# Patient Record
Sex: Female | Born: 1980 | Hispanic: Yes | Marital: Single | Smoking: Current every day smoker
Health system: Southern US, Community
[De-identification: ages and names within clinical notes are randomized; demographics above are authoritative.]

## PROBLEM LIST (undated history)

## (undated) DIAGNOSIS — F419 Anxiety disorder, unspecified: Secondary | ICD-10-CM

## (undated) DIAGNOSIS — J45909 Unspecified asthma, uncomplicated: Secondary | ICD-10-CM

## (undated) HISTORY — DX: Anxiety disorder, unspecified: F41.9

## (undated) HISTORY — PX: OTHER SURGICAL HISTORY: SHX169

---

## 2005-06-27 ENCOUNTER — Emergency Department: Payer: Self-pay | Admitting: Emergency Medicine

## 2005-06-29 ENCOUNTER — Emergency Department: Payer: Self-pay | Admitting: Emergency Medicine

## 2005-09-15 ENCOUNTER — Emergency Department: Payer: Self-pay | Admitting: General Practice

## 2006-03-09 ENCOUNTER — Emergency Department: Payer: Self-pay | Admitting: Internal Medicine

## 2007-02-27 ENCOUNTER — Emergency Department: Payer: Self-pay | Admitting: Internal Medicine

## 2007-05-30 ENCOUNTER — Emergency Department: Payer: Self-pay | Admitting: Emergency Medicine

## 2008-03-29 ENCOUNTER — Emergency Department: Payer: Self-pay | Admitting: Emergency Medicine

## 2008-10-26 ENCOUNTER — Emergency Department: Payer: Self-pay | Admitting: Emergency Medicine

## 2009-04-18 ENCOUNTER — Emergency Department: Payer: Self-pay | Admitting: Emergency Medicine

## 2010-06-28 ENCOUNTER — Emergency Department: Payer: Self-pay

## 2011-02-05 ENCOUNTER — Ambulatory Visit: Payer: Self-pay

## 2011-03-12 ENCOUNTER — Ambulatory Visit: Payer: Self-pay

## 2011-05-01 ENCOUNTER — Emergency Department: Payer: Self-pay | Admitting: Unknown Physician Specialty

## 2013-09-30 ENCOUNTER — Emergency Department: Payer: Self-pay | Admitting: Internal Medicine

## 2014-12-31 ENCOUNTER — Other Ambulatory Visit: Payer: Self-pay

## 2014-12-31 ENCOUNTER — Emergency Department
Admission: EM | Admit: 2014-12-31 | Discharge: 2014-12-31 | Disposition: A | Discharge status: Home / Self Care | Payer: Self-pay | Attending: Emergency Medicine | Admitting: Emergency Medicine

## 2014-12-31 ENCOUNTER — Emergency Department: Payer: Self-pay

## 2014-12-31 ENCOUNTER — Encounter: Payer: Self-pay | Admitting: Emergency Medicine

## 2014-12-31 DIAGNOSIS — Z3202 Encounter for pregnancy test, result negative: Secondary | ICD-10-CM | POA: Insufficient documentation

## 2014-12-31 DIAGNOSIS — M67442 Ganglion, left hand: Secondary | ICD-10-CM | POA: Insufficient documentation

## 2014-12-31 DIAGNOSIS — M79642 Pain in left hand: Secondary | ICD-10-CM

## 2014-12-31 DIAGNOSIS — S6992XA Unspecified injury of left wrist, hand and finger(s), initial encounter: Secondary | ICD-10-CM | POA: Insufficient documentation

## 2014-12-31 DIAGNOSIS — W19XXXA Unspecified fall, initial encounter: Secondary | ICD-10-CM

## 2014-12-31 DIAGNOSIS — Y9289 Other specified places as the place of occurrence of the external cause: Secondary | ICD-10-CM | POA: Insufficient documentation

## 2014-12-31 DIAGNOSIS — T148XXA Other injury of unspecified body region, initial encounter: Secondary | ICD-10-CM

## 2014-12-31 DIAGNOSIS — S3992XA Unspecified injury of lower back, initial encounter: Secondary | ICD-10-CM | POA: Insufficient documentation

## 2014-12-31 DIAGNOSIS — Y9389 Activity, other specified: Secondary | ICD-10-CM | POA: Insufficient documentation

## 2014-12-31 DIAGNOSIS — S299XXA Unspecified injury of thorax, initial encounter: Secondary | ICD-10-CM | POA: Insufficient documentation

## 2014-12-31 DIAGNOSIS — W1839XA Other fall on same level, initial encounter: Secondary | ICD-10-CM | POA: Insufficient documentation

## 2014-12-31 DIAGNOSIS — Y998 Other external cause status: Secondary | ICD-10-CM | POA: Insufficient documentation

## 2014-12-31 DIAGNOSIS — T148 Other injury of unspecified body region: Secondary | ICD-10-CM | POA: Insufficient documentation

## 2014-12-31 DIAGNOSIS — S4992XA Unspecified injury of left shoulder and upper arm, initial encounter: Secondary | ICD-10-CM | POA: Insufficient documentation

## 2014-12-31 DIAGNOSIS — M6740 Ganglion, unspecified site: Secondary | ICD-10-CM

## 2014-12-31 HISTORY — DX: Unspecified asthma, uncomplicated: J45.909

## 2014-12-31 LAB — URINALYSIS COMPLETE WITH MICROSCOPIC (ARMC ONLY)
Bacteria, UA: NONE SEEN
Bilirubin Urine: NEGATIVE
Glucose, UA: NEGATIVE mg/dL
Ketones, ur: NEGATIVE mg/dL
NITRITE: NEGATIVE
PH: 7 (ref 5.0–8.0)
PROTEIN: NEGATIVE mg/dL
Specific Gravity, Urine: 1.012 (ref 1.005–1.030)

## 2014-12-31 LAB — COMPREHENSIVE METABOLIC PANEL
ALT: 15 U/L (ref 14–54)
ANION GAP: 9 (ref 5–15)
AST: 18 U/L (ref 15–41)
Albumin: 4.3 g/dL (ref 3.5–5.0)
Alkaline Phosphatase: 65 U/L (ref 38–126)
BUN: 7 mg/dL (ref 6–20)
CHLORIDE: 106 mmol/L (ref 101–111)
CO2: 23 mmol/L (ref 22–32)
CREATININE: 0.74 mg/dL (ref 0.44–1.00)
Calcium: 8.9 mg/dL (ref 8.9–10.3)
GLUCOSE: 86 mg/dL (ref 65–99)
Potassium: 3.4 mmol/L — ABNORMAL LOW (ref 3.5–5.1)
Sodium: 138 mmol/L (ref 135–145)
Total Bilirubin: 0.5 mg/dL (ref 0.3–1.2)
Total Protein: 7.4 g/dL (ref 6.5–8.1)

## 2014-12-31 LAB — TROPONIN I: Troponin I: <0.03 ng/mL (ref <0.031)

## 2014-12-31 LAB — CBC
HCT: 39.3 % (ref 35.0–47.0)
HEMOGLOBIN: 13.1 g/dL (ref 12.0–16.0)
MCH: 28.2 pg (ref 26.0–34.0)
MCHC: 33.3 g/dL (ref 32.0–36.0)
MCV: 84.9 fL (ref 80.0–100.0)
PLATELETS: 243 10*3/uL (ref 150–440)
RBC: 4.63 MIL/uL (ref 3.80–5.20)
RDW: 15.3 % — ABNORMAL HIGH (ref 11.5–14.5)
WBC: 8.2 10*3/uL (ref 3.6–11.0)

## 2014-12-31 LAB — POCT PREGNANCY, URINE: PREG TEST UR: NEGATIVE

## 2014-12-31 MED ORDER — OXYCODONE-ACETAMINOPHEN 5-325 MG PO TABS
2.0000 | ORAL_TABLET | Freq: Once | ORAL | Status: AC
  Administered 2014-12-31: 2 via ORAL

## 2014-12-31 MED ORDER — SULFAMETHOXAZOLE-TRIMETHOPRIM 800-160 MG PO TABS: 1.0000 | ORAL_TABLET | Freq: Two times a day (BID) | ORAL | Status: DC

## 2014-12-31 MED ORDER — OXYCODONE-ACETAMINOPHEN 5-325 MG PO TABS
ORAL_TABLET | ORAL | Status: AC
  Filled 2014-12-31: qty 2

## 2014-12-31 MED ORDER — IBUPROFEN 800 MG PO TABS: 800.0000 mg | ORAL_TABLET | Freq: Three times a day (TID) | ORAL | Status: DC | PRN

## 2014-12-31 MED ORDER — DIAZEPAM 5 MG PO TABS: 5.0000 mg | ORAL_TABLET | Freq: Three times a day (TID) | ORAL | Status: DC | PRN

## 2014-12-31 NOTE — ED Notes (Signed)
States did get dizzy prior to fall

## 2014-12-31 NOTE — ED Provider Notes (Addendum)
[facility] Emergency Department Provider Note     Time seen: ----------------------------------------- 3:01 PM on 12/31/2014 -----------------------------------------    I have reviewed the triage vital signs and the nursing notes.   HISTORY  Chief Complaint Near Syncope    HPI Kristina Lowery is a 34 y.o. female who presents ER after falling several days ago. 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 Patient states after she fell she was having some back and shoulder pain which worsened after she lifted some pallets at work. Patient states she tripped and fell 2 days ago, she did not get weak and dizzy and fall or nearly pass out. She denies history of same, pain is severe and left shoulder low back. Worse with range of motion and movement.   Past Medical History  Diagnosis Date  . Asthma     There are no active problems to display for this patient.   History reviewed. No pertinent past surgical history.  Allergies Apple; Banana; Kiwi extract; and Strawberry  Social History History  Substance Use Topics  . Smoking status: Not on file  . Smokeless tobacco: Not on file  . Alcohol Use: Not on file    Review of Systems Constitutional: Negative for fever. Eyes: Negative for visual changes. ENT: Negative for sore throat. Cardiovascular: Negative for chest pain. Respiratory: Negative for shortness of breath. Gastrointestinal: Negative for abdominal pain, vomiting and diarrhea. Genitourinary: Negative for dysuria. Musculoskeletal: Positive for low back pain, shoulder pain, left rib pain, left hand pain Skin: Negative for rash. Neurological: Negative for headaches, focal weakness or numbness.  10-point ROS otherwise negative.  ____________________________________________   PHYSICAL EXAM:  VITAL SIGNS: ED Triage Vitals  Enc Vitals Group     BP 12/31/14 1255 138/97 mmHg     Pulse Rate 12/31/14 1255 83     Resp  12/31/14 1255 18     Temp 12/31/14 1255 98.2 F (36.8 C)     Temp Source 12/31/14 1255 Oral     SpO2 12/31/14 1255 99 %     Weight 12/31/14 1255 195 lb (88.451 kg)     Height 12/31/14 1255 5\' 7"  (1.702 m)     Head Cir --      Peak Flow --      Pain Score 12/31/14 1258 10     Pain Loc --      Pain Edu? --      Excl. in [place]? --     Constitutional: Alert and oriented. Well appearing and in no distress. Eyes: Conjunctivae are normal. PERRL. Normal extraocular movements. ENT   Head: Normocephalic and atraumatic.   Nose: No congestion/rhinnorhea.   Mouth/Throat: Mucous membranes are moist.   Neck: No stridor. Hematological/Lymphatic/Immunilogical: No cervical lymphadenopathy. Cardiovascular: Normal rate, regular rhythm. Normal and symmetric distal pulses are present in all extremities. No murmurs, rubs, or gallops. Respiratory: Normal respiratory effort without tachypnea nor retractions. Breath sounds are clear and equal bilaterally. No wheezes/rales/rhonchi. Gastrointestinal: Soft and nontender. No distention. No abdominal bruits. There is no CVA tenderness. Musculoskeletal: Pain range of motion of the left shoulder, left hand, there is tenderness to percussion over the left inferior anterior ribs, there is also tenderness along the lumbar sacral spine. Neurologic:  Normal speech and language. No gross focal neurologic deficits are appreciated. Speech is normal. No gait instability. Skin:  Skin is warm, dry and intact. No rash noted. Patient's small ganglion cyst noted on the dorsum of the left hand. Psychiatric: Mood and  affect are normal. Speech and behavior are normal. Patient exhibits appropriate insight and judgment. ____________________________________________  EKG: Interpreted by me. Normal sinus rhythm with normal axis normal intervals no evidence of hypertrophy or acute infarction, rate is 79.  ____________________________________________  ED COURSE:  Pertinent  labs & imaging results that were available during my care of the patient were reviewed by me and considered in my medical decision making (see chart for details). Patient likely with muscle strain and contusion status post fall. ____________________________________________    LABS (pertinent positives/negatives)  Labs Reviewed  CBC - Abnormal; Notable for the following:    RDW 15.3 (*)    All other components within normal limits  COMPREHENSIVE METABOLIC PANEL - Abnormal; Notable for the following:    Potassium 3.4 (*)    All other components within normal limits  URINALYSIS COMPLETEWITH MICROSCOPIC ([facility] ONLY) - Abnormal; Notable for the following:    Color, Urine YELLOW (*)    APPearance HAZY (*)    Hgb urine dipstick 1+ (*)    Leukocytes, UA 3+ (*)    Squamous Epithelial / LPF 6-30 (*)    All other components within normal limits  TROPONIN I  POC URINE PREG, ED  POCT PREGNANCY, URINE    RADIOLOGY Images were viewed by me  Chest x-ray, shoulder, lumbar sacral spine, hand are all unremarkable.  ____________________________________________  FINAL ASSESSMENT AND PLAN  Fall, contusion, muscle strain, ganglion cyst  Plan: X-rays are unremarkable, she'll continue on Motrin and Valium, return for worsening or worrisome symptoms. Also advised heating pad and massage therapy stretching.  Earleen Newport, MD   Earleen Newport, MD 12/31/14 [place], MD 12/31/14 [phone]

## 2014-12-31 NOTE — Discharge Instructions (Signed)
Contusion A contusion is a deep bruise. Contusions are the result of an injury that caused bleeding under the skin. The contusion may turn blue, purple, or yellow. Minor injuries will give you a painless contusion, but more severe contusions may stay painful and swollen for a few weeks.  CAUSES  A contusion is usually caused by a blow, trauma, or direct force to an area of the body. SYMPTOMS   Swelling and redness of the injured area.  Bruising of the injured area.  Tenderness and soreness of the injured area.  Pain. DIAGNOSIS  The diagnosis can be made by taking a history and physical exam. An X-ray, CT scan, or MRI may be needed to determine if there were any associated injuries, such as fractures. TREATMENT  Specific treatment will depend on what area of the body was injured. In general, the best treatment for a contusion is resting, icing, elevating, and applying cold compresses to the injured area. Over-the-counter medicines may also be recommended for pain control. Ask your caregiver what the best treatment is for your contusion. HOME CARE INSTRUCTIONS   Put ice on the injured area.  Put ice in a plastic bag.  Place a towel between your skin and the bag.  Leave the ice on for 15-20 minutes, 3-4 times a day, or as directed by your health care provider.  Only take over-the-counter or prescription medicines for pain, discomfort, or fever as directed by your caregiver. Your caregiver may recommend avoiding anti-inflammatory medicines (aspirin, ibuprofen, and naproxen) for 48 hours because these medicines may increase bruising.  Rest the injured area.  If possible, elevate the injured area to reduce swelling. SEEK IMMEDIATE MEDICAL CARE IF:   You have increased bruising or swelling.  You have pain that is getting worse.  Your swelling or pain is not relieved with medicines. MAKE SURE YOU:   Understand these instructions.  Will watch your condition.  Will get help right  away if you are not doing well or get worse. Document Released: 04/03/2005 Document Revised: 06/29/2013 Document Reviewed: 04/29/2011 [facility] Patient Information 2015 [place], [place]. This information is not intended to replace advice given to you by your health care provider. Make sure you discuss any questions you have with your health care provider.  Ganglion Cyst A ganglion cyst is a noncancerous, fluid-filled lump that occurs near joints or tendons. The ganglion cyst grows out of a joint or the lining of a tendon. It most often develops in the hand or wrist but can also develop in the shoulder, elbow, hip, knee, ankle, or foot. The round or oval ganglion can be pea sized or larger than a grape. Increased activity may enlarge the size of the cyst because more fluid starts to build up.  CAUSES  It is not completely known what causes a ganglion cyst to grow. However, it may be related to:  Inflammation or irritation around the joint.  An injury.  Repetitive movements or overuse.  Arthritis. SYMPTOMS  A lump most often appears in the hand or wrist, but can occur in other areas of the body. Generally, the lump is painless without other symptoms. However, sometimes pain can be felt during activity or when pressure is applied to the lump. The lump may even be tender to the touch. Tingling, pain, numbness, or muscle weakness can occur if the ganglion cyst presses on a nerve. Your grip may be weak and you may have less movement in your joints.  DIAGNOSIS  Ganglion cysts are most  often diagnosed based on a physical exam, noting where the cyst is and how it looks. Your caregiver will feel the lump and may shine a light alongside it. If it is a ganglion, a light often shines through it. Your caregiver may order an X-ray, ultrasound, or MRI to rule out other conditions. TREATMENT  Ganglions usually go away on their own without treatment. If pain or other symptoms are involved, treatment may be needed.  Treatment is also needed if the ganglion limits your movement or if it gets infected. Treatment options include:  Wearing a wrist or finger brace or splint.  Taking anti-inflammatory medicine.  Draining fluid from the lump with a needle (aspiration).  Injecting a steroid into the joint.  Surgery to remove the ganglion cyst and its stalk that is attached to the joint or tendon. However, ganglion cysts can grow back. HOME CARE INSTRUCTIONS   Do not press on the ganglion, poke it with a needle, or hit it with a heavy object. You may rub the lump gently and often. Sometimes fluid moves out of the cyst.  Only take medicines as directed by your caregiver.  Wear your brace or splint as directed by your caregiver. SEEK MEDICAL CARE IF:   Your ganglion becomes larger or more painful.  You have increased redness, red streaks, or swelling.  You have pus coming from the lump.  You have weakness or numbness in the affected area. MAKE SURE YOU:   Understand these instructions.  Will watch your condition.  Will get help right away if you are not doing well or get worse. Document Released: 06/21/2000 Document Revised: 03/18/2012 Document Reviewed: 08/18/2007 [facility] Patient Information 2015 [place], [place]. This information is not intended to replace advice given to you by your health care provider. Make sure you discuss any questions you have with your health care provider.  Muscle Strain A muscle strain is an injury that occurs when a muscle is stretched beyond its normal length. Usually a small number of muscle fibers are torn when this happens. Muscle strain is rated in degrees. First-degree strains have the least amount of muscle fiber tearing and pain. Second-degree and third-degree strains have increasingly more tearing and pain.  Usually, recovery from muscle strain takes 1-2 weeks. Complete healing takes 5-6 weeks.  CAUSES  Muscle strain happens when a sudden, violent force placed  on a muscle stretches it too far. This may occur with lifting, sports, or a fall.  RISK FACTORS Muscle strain is especially common in athletes.  SIGNS AND SYMPTOMS At the site of the muscle strain, there may be:  Pain.  Bruising.  Swelling.  Difficulty using the muscle due to pain or lack of normal function. DIAGNOSIS  Your health care provider will perform a physical exam and ask about your medical history. TREATMENT  Often, the best treatment for a muscle strain is resting, icing, and applying cold compresses to the injured area.  HOME CARE INSTRUCTIONS   Use the PRICE method of treatment to promote muscle healing during the first 2-3 days after your injury. The PRICE method involves:  Protecting the muscle from being injured again.  Restricting your activity and resting the injured body part.  Icing your injury. To do this, put ice in a plastic bag. Place a towel between your skin and the bag. Then, apply the ice and leave it on from 15-20 minutes each hour. After the third day, switch to moist heat packs.  Apply compression to the injured area  with a splint or elastic bandage. Be careful not to wrap it too tightly. This may interfere with blood circulation or increase swelling.  Elevate the injured body part above the level of your heart as often as you can.  Only take over-the-counter or prescription medicines for pain, discomfort, or fever as directed by your health care provider.  Warming up prior to exercise helps to prevent future muscle strains. SEEK MEDICAL CARE IF:   You have increasing pain or swelling in the injured area.  You have numbness, tingling, or a significant loss of strength in the injured area. MAKE SURE YOU:   Understand these instructions.  Will watch your condition.  Will get help right away if you are not doing well or get worse. Document Released: 06/24/2005 Document Revised: 04/14/2013 Document Reviewed: 01/21/2013 [facility] Patient  Information 2015 [place], [place]. This information is not intended to replace advice given to you by your health care provider. Make sure you discuss any questions you have with your health care provider.

## 2014-12-31 NOTE — ED Notes (Signed)
Patient transported to X-ray 

## 2016-07-17 ENCOUNTER — Emergency Department
Admission: EM | Admit: 2016-07-17 | Discharge: 2016-07-17 | Disposition: A | Discharge status: Home / Self Care | Payer: Self-pay | Attending: Emergency Medicine | Admitting: Emergency Medicine

## 2016-07-17 ENCOUNTER — Encounter: Payer: Self-pay | Admitting: Emergency Medicine

## 2016-07-17 DIAGNOSIS — J45909 Unspecified asthma, uncomplicated: Secondary | ICD-10-CM | POA: Insufficient documentation

## 2016-07-17 DIAGNOSIS — Z5321 Procedure and treatment not carried out due to patient leaving prior to being seen by health care provider: Secondary | ICD-10-CM | POA: Insufficient documentation

## 2016-07-17 DIAGNOSIS — F17200 Nicotine dependence, unspecified, uncomplicated: Secondary | ICD-10-CM | POA: Insufficient documentation

## 2016-07-17 DIAGNOSIS — R1032 Left lower quadrant pain: Secondary | ICD-10-CM | POA: Insufficient documentation

## 2016-07-17 LAB — CBC
HCT: 38.2 % (ref 35.0–47.0)
Hemoglobin: 12.9 g/dL (ref 12.0–16.0)
MCH: 28.1 pg (ref 26.0–34.0)
MCHC: 33.7 g/dL (ref 32.0–36.0)
MCV: 83.3 fL (ref 80.0–100.0)
Platelets: 257 10*3/uL (ref 150–440)
RBC: 4.59 MIL/uL (ref 3.80–5.20)
RDW: 15.4 % — ABNORMAL HIGH (ref 11.5–14.5)
WBC: 6.1 10*3/uL (ref 3.6–11.0)

## 2016-07-17 LAB — URINALYSIS, COMPLETE (UACMP) WITH MICROSCOPIC
Bacteria, UA: NONE SEEN
Bilirubin Urine: NEGATIVE
GLUCOSE, UA: NEGATIVE mg/dL
Ketones, ur: NEGATIVE mg/dL
Leukocytes, UA: NEGATIVE
Nitrite: NEGATIVE
Protein, ur: NEGATIVE mg/dL
Specific Gravity, Urine: 1.015 (ref 1.005–1.030)
pH: 8 (ref 5.0–8.0)

## 2016-07-17 LAB — COMPREHENSIVE METABOLIC PANEL
ALBUMIN: 3.9 g/dL (ref 3.5–5.0)
ALT: 13 U/L — AB (ref 14–54)
AST: 17 U/L (ref 15–41)
Alkaline Phosphatase: 77 U/L (ref 38–126)
Anion gap: 7 (ref 5–15)
BUN: 9 mg/dL (ref 6–20)
CALCIUM: 9.2 mg/dL (ref 8.9–10.3)
CO2: 27 mmol/L (ref 22–32)
Chloride: 105 mmol/L (ref 101–111)
Creatinine, Ser: 0.77 mg/dL (ref 0.44–1.00)
GFR calc Af Amer: >60 mL/min (ref >60)
GFR calc non Af Amer: >60 mL/min (ref >60)
Glucose, Bld: 105 mg/dL — ABNORMAL HIGH (ref 65–99)
Potassium: 3.4 mmol/L — ABNORMAL LOW (ref 3.5–5.1)
SODIUM: 139 mmol/L (ref 135–145)
Total Bilirubin: 0.7 mg/dL (ref 0.3–1.2)
Total Protein: 7.3 g/dL (ref 6.5–8.1)

## 2016-07-17 LAB — LIPASE, BLOOD: LIPASE: 16 U/L (ref 11–51)

## 2016-07-17 NOTE — ED Notes (Signed)
Wait explained to patient.  Wearing face mask.  No new complaints.

## 2016-07-17 NOTE — ED Triage Notes (Signed)
Pt to ed with c/o abd pain in left lower quad.  Pt also reports n/v and dizziness.  Pt also reports headache.

## 2016-12-17 ENCOUNTER — Encounter: Payer: Self-pay | Admitting: Emergency Medicine

## 2016-12-17 ENCOUNTER — Emergency Department
Admission: EM | Admit: 2016-12-17 | Discharge: 2016-12-17 | Disposition: A | Discharge status: Home / Self Care | Payer: Self-pay | Attending: Student in an Organized Health Care Education/Training Program | Admitting: Student in an Organized Health Care Education/Training Program

## 2016-12-17 DIAGNOSIS — J4521 Mild intermittent asthma with (acute) exacerbation: Secondary | ICD-10-CM | POA: Insufficient documentation

## 2016-12-17 DIAGNOSIS — F17200 Nicotine dependence, unspecified, uncomplicated: Secondary | ICD-10-CM | POA: Insufficient documentation

## 2016-12-17 DIAGNOSIS — Z79899 Other long term (current) drug therapy: Secondary | ICD-10-CM | POA: Insufficient documentation

## 2016-12-17 MED ORDER — METHYLPREDNISOLONE 4 MG PO TBPK: ORAL_TABLET | ORAL | 0 refills | Status: DC

## 2016-12-17 MED ORDER — BENZONATATE 100 MG PO CAPS: 100.0000 mg | ORAL_CAPSULE | Freq: Three times a day (TID) | ORAL | 0 refills | Status: DC | PRN

## 2016-12-17 MED ORDER — IPRATROPIUM-ALBUTEROL 0.5-2.5 (3) MG/3ML IN SOLN
3.0000 mL | Freq: Once | RESPIRATORY_TRACT | Status: AC
  Administered 2016-12-17: 3 mL via RESPIRATORY_TRACT
  Filled 2016-12-17: qty 3

## 2016-12-17 MED ORDER — ALBUTEROL SULFATE HFA 108 (90 BASE) MCG/ACT IN AERS: 2.0000 | INHALATION_SPRAY | Freq: Four times a day (QID) | RESPIRATORY_TRACT | 2 refills | Status: DC | PRN

## 2016-12-17 MED ORDER — METHYLPREDNISOLONE SODIUM SUCC 125 MG IJ SOLR
125.0000 mg | Freq: Once | INTRAMUSCULAR | Status: AC
  Administered 2016-12-17: 125 mg via INTRAMUSCULAR
  Filled 2016-12-17: qty 2

## 2016-12-17 NOTE — ED Notes (Signed)
See triage note  States her breathing became worse this am   Lost her inhaler  Audible wheezing noted  resp slightly labored

## 2016-12-17 NOTE — ED Triage Notes (Signed)
Asthma attack, onset of symptoms this morning.  Patient states she lost her Albuterol inhaler.

## 2016-12-17 NOTE — ED Provider Notes (Signed)
[facility] Emergency Department Provider Note   ____________________________________________   First MD Initiated Contact with Patient 12/17/16 1805     (approximate)  I have reviewed the triage vital signs and the nursing notes.   HISTORY  Chief Complaint Wheezing    HPI Kristina Lowery is a 36 y.o. female  Patient complaining of acute onset tach overuse of point a.m. Awakening. Patient has a history of asthma but has not needed an inhaler for over 6 months. Patient states she also has a nonproductive cough. Patient is given a history in short sentences. Patient states she no longer has an inhaler so no palliative measures taken for this complaint. Patient denies pain with this complaint. Patient does admit to tobacco use.   Past Medical History:  Diagnosis Date  . Asthma     There are no active problems to display for this patient.   History reviewed. No pertinent surgical history.  Prior to Admission medications   Medication Sig Start Date End Date Taking? Authorizing Provider  albuterol (PROVENTIL HFA;VENTOLIN HFA) 108 (90 Base) MCG/ACT inhaler Inhale 2 puffs into the lungs every 6 (six) hours as needed for wheezing or shortness of breath. 12/17/16   Sable Feil, PA-C  albuterol (PROVENTIL) (2.5 MG/3ML) 0.083% nebulizer solution Take 2.5 mg by nebulization every 6 (six) hours as needed for wheezing or shortness of breath.    [provider]  benzonatate (TESSALON PERLES) 100 MG capsule Take 1 capsule (100 mg total) by mouth 3 (three) times daily as needed for cough. 12/17/16   Sable Feil, PA-C  diazepam (VALIUM) 5 MG tablet Take 1 tablet (5 mg total) by mouth every 8 (eight) hours as needed for muscle spasms. 12/31/14   Earleen Newport, MD  diphenhydrAMINE (BENADRYL) 25 MG tablet Take 25 mg by mouth 2 (two) times daily as needed for itching or allergies.    [provider]  ibuprofen (ADVIL,MOTRIN) 200 MG tablet Take  800 mg by mouth 3 (three) times daily as needed for mild pain or moderate pain.    [provider]  ibuprofen (ADVIL,MOTRIN) 800 MG tablet Take 1 tablet (800 mg total) by mouth every 8 (eight) hours as needed. 12/31/14   Earleen Newport, MD  methylPREDNISolone (MEDROL DOSEPAK) 4 MG TBPK tablet Take Tapered dose as directed 12/17/16   Sable Feil, PA-C  sulfamethoxazole-trimethoprim (BACTRIM DS) 800-160 MG per tablet Take 1 tablet by mouth 2 (two) times daily. 12/31/14   Earleen Newport, MD    Allergies Apple; Banana; Kiwi extract; Strawberry extract; and Watermelon [citrullus vulgaris]  No family history on file.  Social History Social History  Substance Use Topics  . Smoking status: Current Every Day Smoker  . Smokeless tobacco: Never Used  . Alcohol use Yes    Review of Systems  Constitutional: No fever/chills Eyes: No visual changes. ENT: No sore throat. Cardiovascular: Denies chest pain. Respiratory:  Shortness of breath and wheezing. Gastrointestinal: No abdominal pain.  No nausea, no vomiting.  No diarrhea.  No constipation. Genitourinary: Negative for dysuria. Musculoskeletal: Negative for back pain. Skin: Negative for rash. Neurological: Negative for headaches, focal weakness or numbness. Allergic/Immunilogical: for allergies ____________________________________________   PHYSICAL EXAM:  VITAL SIGNS: ED Triage Vitals  Enc Vitals Group     BP 12/17/16 1755 135/82     Pulse Rate 12/17/16 1755 67     Resp 12/17/16 1755 18     Temp 12/17/16 1755 98.2 F (36.8 C)  Temp Source 12/17/16 1755 Oral     SpO2 12/17/16 1755 100 %     Weight 12/17/16 1756 198 lb (89.8 kg)     Height 12/17/16 1756 5\' 8"  (1.727 m)     Head Circumference --      Peak Flow --      Pain Score 12/17/16 1755 0     Pain Loc --      Pain Edu? --      Excl. in [place]? --     Constitutional: Alert and oriented. Well appearing and in no acute distress. Head:  Atraumatic. Nose: No congestion/rhinnorhea. Mouth/Throat: Mucous membranes are moist.  Oropharynx non-erythematous. Neck: No stridor.   Cardiovascular: Normal rate, regular rhythm. Grossly normal heart sounds.  Good peripheral circulation. Respiratory: Normal respiratory effort.  No retractions.  Bilateral expiratory wheezing.  Nonproductive cough Neurologic:  Normal speech and language. No gross focal neurologic deficits are appreciated. No gait instability. Skin:  Skin is warm, dry and intact. No rash noted. Psychiatric: Mood and affect are normal. Speech and behavior are normal.  ____________________________________________   LABS (all labs ordered are listed, but only abnormal results are displayed)  Labs Reviewed - No data to display ____________________________________________  EKG   ____________________________________________  RADIOLOGY  No results found.  ____________________________________________   PROCEDURES  Procedure(s) performed: None  Procedures  Critical Care performed: No  ____________________________________________   INITIAL IMPRESSION / ASSESSMENT AND PLAN / ED COURSE  Pertinent labs & imaging results that were available during my care of the patient were reviewed by me and considered in my medical decision making (see chart for details).  Asthma exacerbation.  Patient state moderate relief status post Nebulized treatment. Patient given discharge care instructions. [facility]     ____________________________________________   FINAL CLINICAL IMPRESSION(S) / ED DIAGNOSES  Final diagnoses:  Mild intermittent asthma with exacerbation      NEW MEDICATIONS STARTED DURING THIS VISIT:  New Prescriptions   ALBUTEROL (PROVENTIL HFA;VENTOLIN HFA) 108 (90 BASE) MCG/ACT INHALER    Inhale 2 puffs into the lungs every 6 (six) hours as needed for wheezing or shortness of breath.    BENZONATATE (TESSALON PERLES) 100 MG CAPSULE    Take 1 capsule (100 mg total) by mouth 3 (three) times daily as needed for cough.   METHYLPREDNISOLONE (MEDROL DOSEPAK) 4 MG TBPK TABLET    Take Tapered dose as directed     Note:  This document was prepared using Dragon voice recognition software and may include unintentional dictation errors.    Sable Feil, PA-C 12/17/16 1830    Merlyn Lot, MD 12/17/16 1919

## 2017-04-07 ENCOUNTER — Emergency Department: Payer: Self-pay

## 2017-04-07 ENCOUNTER — Encounter: Payer: Self-pay | Admitting: Emergency Medicine

## 2017-04-07 ENCOUNTER — Emergency Department
Admission: EM | Admit: 2017-04-07 | Discharge: 2017-04-07 | Disposition: A | Discharge status: Home / Self Care | Payer: Self-pay | Attending: Emergency Medicine | Admitting: Emergency Medicine

## 2017-04-07 DIAGNOSIS — Z79899 Other long term (current) drug therapy: Secondary | ICD-10-CM | POA: Insufficient documentation

## 2017-04-07 DIAGNOSIS — J4521 Mild intermittent asthma with (acute) exacerbation: Secondary | ICD-10-CM | POA: Insufficient documentation

## 2017-04-07 DIAGNOSIS — J189 Pneumonia, unspecified organism: Secondary | ICD-10-CM | POA: Insufficient documentation

## 2017-04-07 DIAGNOSIS — F17200 Nicotine dependence, unspecified, uncomplicated: Secondary | ICD-10-CM | POA: Insufficient documentation

## 2017-04-07 MED ORDER — METHYLPREDNISOLONE SODIUM SUCC 125 MG IJ SOLR
125.0000 mg | Freq: Once | INTRAMUSCULAR | Status: AC
  Administered 2017-04-07: 125 mg via INTRAMUSCULAR
  Filled 2017-04-07: qty 2

## 2017-04-07 MED ORDER — IPRATROPIUM-ALBUTEROL 0.5-2.5 (3) MG/3ML IN SOLN
3.0000 mL | Freq: Once | RESPIRATORY_TRACT | Status: AC
  Administered 2017-04-07: 3 mL via RESPIRATORY_TRACT
  Filled 2017-04-07: qty 3

## 2017-04-07 MED ORDER — ALBUTEROL SULFATE (2.5 MG/3ML) 0.083% IN NEBU: 2.5000 mg | INHALATION_SOLUTION | Freq: Four times a day (QID) | RESPIRATORY_TRACT | 0 refills | Status: DC | PRN

## 2017-04-07 MED ORDER — AZITHROMYCIN 250 MG PO TABS: ORAL_TABLET | ORAL | 0 refills | Status: DC

## 2017-04-07 MED ORDER — PREDNISONE 50 MG PO TABS: 50.0000 mg | ORAL_TABLET | Freq: Every day | ORAL | 0 refills | Status: DC

## 2017-04-07 NOTE — ED Notes (Signed)
See triage note  Presents with some SOB and wheezing  Per mom her inhaler isn't working well   Scattered wheezing noted

## 2017-04-07 NOTE — ED Provider Notes (Signed)
[facility] Emergency Department Provider Note  ____________________________________________  Time seen: Approximately 2:14 PM  I have reviewed the triage vital signs and the nursing notes.   HISTORY  Chief Complaint Wheezing    HPI Kristina Lowery is a 36 y.o. female presents emergency department complaining of wheezing, shortness of breath, chest tightness. Patient has a history of asthma but states over the past several days she has had coughing, shortness of breath, wheezing, fevers. Patient reports that she's been using her inhaler at home with no relief. Patient is now out of her inhaler. Patient does not take any daily medications. Patient denies any headache, visual changes, nasal congestions, sore throat, abdominal pain, nausea or vomiting, diarrhea or constipation.   Past Medical History:  Diagnosis Date  . Asthma     There are no active problems to display for this patient.   History reviewed. No pertinent surgical history.  Prior to Admission medications   Medication Sig Start Date End Date Taking? Authorizing Provider  albuterol (PROVENTIL HFA;VENTOLIN HFA) 108 (90 Base) MCG/ACT inhaler Inhale 2 puffs into the lungs every 6 (six) hours as needed for wheezing or shortness of breath. 12/17/16   Sable Feil, PA-C  albuterol (PROVENTIL) (2.5 MG/3ML) 0.083% nebulizer solution Take 3 mLs (2.5 mg total) by nebulization every 6 (six) hours as needed for wheezing or shortness of breath. 04/07/17   Makensey Rego, Charline Bills, PA-C  azithromycin (ZITHROMAX Z-PAK) 250 MG tablet Take 2 tablets (500 mg) on  Day 1,  followed by 1 tablet (250 mg) once daily on Days 2 through 5. 04/07/17   Ajai Terhaar, Charline Bills, PA-C  benzonatate (TESSALON PERLES) 100 MG capsule Take 1 capsule (100 mg total) by mouth 3 (three) times daily as needed for cough. 12/17/16   Sable Feil, PA-C  diazepam (VALIUM) 5 MG tablet Take 1 tablet (5 mg total) by mouth every 8 (eight) hours as  needed for muscle spasms. 12/31/14   Earleen Newport, MD  diphenhydrAMINE (BENADRYL) 25 MG tablet Take 25 mg by mouth 2 (two) times daily as needed for itching or allergies.    [provider]  ibuprofen (ADVIL,MOTRIN) 200 MG tablet Take 800 mg by mouth 3 (three) times daily as needed for mild pain or moderate pain.    [provider]  ibuprofen (ADVIL,MOTRIN) 800 MG tablet Take 1 tablet (800 mg total) by mouth every 8 (eight) hours as needed. 12/31/14   Earleen Newport, MD  methylPREDNISolone (MEDROL DOSEPAK) 4 MG TBPK tablet Take Tapered dose as directed 12/17/16   Sable Feil, PA-C  predniSONE (DELTASONE) 50 MG tablet Take 1 tablet (50 mg total) by mouth daily with breakfast. 04/07/17   Diamond Jentz, Charline Bills, PA-C  sulfamethoxazole-trimethoprim (BACTRIM DS) 800-160 MG per tablet Take 1 tablet by mouth 2 (two) times daily. 12/31/14   Earleen Newport, MD    Allergies Apple; Banana; Kiwi extract; Strawberry extract; and Watermelon [citrullus vulgaris]  No family history on file.  Social History Social History  Substance Use Topics  . Smoking status: Current Every Day Smoker  . Smokeless tobacco: Never Used  . Alcohol use Yes     Review of Systems  Constitutional: Positive fever/chills Eyes: No visual changes. No discharge ENT: No upper respiratory complaints. Cardiovascular: no chest pain. Respiratory:  Positive for cough, shotess o beath, wheeng Gastrointestinal: No abdominal pain.  No nausea, no vomiting.  No diarrhea.  No constipation. Musculoskeletal: Negative for musculoskeletal pain. Skin: Negative for rash, abrasions,  lacerations, ecchymosis. Neurological: Negative for headaches, focal weakness or numbness. 10-point ROS otherwise negative.  ____________________________________________   PHYSICAL EXAM:  VITAL SIGNS: ED Triage Vitals [04/07/17 1349]  Enc Vitals Group     BP (!) 154/92     Pulse Rate 81     Resp 18     Temp 98.3 F  (36.8 C)     Temp Source Oral     SpO2 97 %     Weight 198 lb (89.8 kg)     Height      Head Circumference      Peak Flow      Pain Score      Pain Loc      Pain Edu?      Excl. in [place]?      Constitutional: Alert and oriented. Well appearing and in no acute distress. Eyes: Conjunctivae are normal. PERRL. EOMI. Head: Atraumatic. ENT:      Ears:       Nose: No congestion/rhinnorhea.      Mouth/Throat: Mucous membranes are moist.  Neck: No stridor. Neck is supple with full range of motion Hematological/Lymphatic/Immunilogical: No cervical lymphadenopathy.  Cardiovascular: Normal rate, regular rhythm. Normal S1 and S2.  Good peripheral circulation. Respiratory: Normal respiratory effort without tachypnea or retractions. Lungs with diffuse inspiratory and respiratory wheezing bilaterally. Crackles and rhonchi bilateral lower lung fields.Kermit Balo air entry to the bases with no decreased or absent breath sounds. Musculoskeletal: Full range of motion to all extremities. No gross deformities appreciated. Neurologic:  Normal speech and language. No gross focal neurologic deficits are appreciated.  Skin:  Skin is warm, dry and intact. No rash noted. Psychiatric: Mood and affect are normal. Speech and behavior are normal. Patient exhibits appropriate insight and judgement.   ____________________________________________   LABS (all labs ordered are listed, but only abnormal results are displayed)  Labs Reviewed - No data to display ____________________________________________  EKG   ____________________________________________  RADIOLOGY Diamantina Providence Taliyah Watrous, personally viewed and evaluated these images (plain radiographs) as part of my medical decision making, as well as reviewing the written report by the radiologist.  Dg Chest 2 View  Result Date: 04/07/2017 CLINICAL DATA:  Two-day history of wheezing. EXAM: CHEST  2 VIEW COMPARISON:  12/31/2014 FINDINGS: The lungs are clear  without focal pneumonia, edema, pneumothorax or pleural effusion. The cardiopericardial silhouette is within normal limits for size. The visualized bony structures of the thorax are intact. IMPRESSION: Stable.  No acute findings. Electronically Signed   By: Misty Stanley M.D.   On: 04/07/2017 14:55    ____________________________________________    PROCEDURES  Procedure(s) performed:    Procedures    Medications  ipratropium-albuterol (DUONEB) 0.5-2.5 (3) MG/3ML nebulizer solution 3 mL (3 mLs Nebulization Given 04/07/17 1456)  methylPREDNISolone sodium succinate (SOLU-MEDROL) 125 mg/2 mL injection 125 mg (125 mg Intramuscular Given 04/07/17 1436)     ____________________________________________   INITIAL IMPRESSION / ASSESSMENT AND PLAN / ED COURSE  Pertinent labs & imaging results that were available during my care of the patient were reviewed by me and considered in my medical decision making (see chart for details).  Review of the [place] CSRS was performed in accordance of the [place] prior to dispensing any controlled drugs.     Patient's diagnosis is consistent with Community acquired pneumonia exacerbating chronic asthma. X-ray reveals no acute consolidation, however with the patient's exam, symptoms, patient will be treated empirically for community acquired pneumonia.. Patient will be discharged home with prescriptions  for Zithromax, steroids, albuterol. Patient is to follow up with primary care as needed or otherwise directed. Patient is given ED precautions to return to the ED for any worsening or new symptoms.     ____________________________________________  FINAL CLINICAL IMPRESSION(S) / ED DIAGNOSES  Final diagnoses:  Mild intermittent asthma with exacerbation  Community acquired pneumonia, unspecified laterality      NEW MEDICATIONS STARTED DURING THIS VISIT:  New Prescriptions   ALBUTEROL (PROVENTIL) (2.5 MG/3ML) 0.083% NEBULIZER SOLUTION    Take 3 mLs (2.5  mg total) by nebulization every 6 (six) hours as needed for wheezing or shortness of breath.   AZITHROMYCIN (ZITHROMAX Z-PAK) 250 MG TABLET    Take 2 tablets (500 mg) on  Day 1,  followed by 1 tablet (250 mg) once daily on Days 2 through 5.   PREDNISONE (DELTASONE) 50 MG TABLET    Take 1 tablet (50 mg total) by mouth daily with breakfast.        This chart was dictated using voice recognition software/Dragon. Despite best efforts to proofread, errors can occur which can change the meaning. Any change was purely unintentional.    Darletta Moll, PA-C 04/07/17 1548    Eula Listen, MD 04/07/17 [phone]

## 2017-04-07 NOTE — ED Triage Notes (Signed)
Pt with wheezing for two days and inhaler at home stopped working.

## 2017-08-07 ENCOUNTER — Emergency Department: Payer: Self-pay

## 2017-08-07 ENCOUNTER — Emergency Department
Admission: EM | Admit: 2017-08-07 | Discharge: 2017-08-07 | Disposition: A | Discharge status: Home / Self Care | Payer: Self-pay | Attending: Emergency Medicine | Admitting: Emergency Medicine

## 2017-08-07 ENCOUNTER — Other Ambulatory Visit: Payer: Self-pay

## 2017-08-07 DIAGNOSIS — K029 Dental caries, unspecified: Secondary | ICD-10-CM | POA: Insufficient documentation

## 2017-08-07 DIAGNOSIS — F17200 Nicotine dependence, unspecified, uncomplicated: Secondary | ICD-10-CM | POA: Insufficient documentation

## 2017-08-07 DIAGNOSIS — J45909 Unspecified asthma, uncomplicated: Secondary | ICD-10-CM | POA: Insufficient documentation

## 2017-08-07 MED ORDER — AMOXICILLIN 500 MG PO CAPS: 500.0000 mg | ORAL_CAPSULE | Freq: Three times a day (TID) | ORAL | 0 refills | Status: DC

## 2017-08-07 MED ORDER — IBUPROFEN 600 MG PO TABS: 600.0000 mg | ORAL_TABLET | Freq: Three times a day (TID) | ORAL | 0 refills | Status: DC | PRN

## 2017-08-07 NOTE — Discharge Instructions (Signed)
Continue antibiotics as directed for the next 10 days.  Ibuprofen as needed for pain.  Recall 1 of the dentist listed on your dental clinic.  Also [facility] has walk-in hours.  OPTIONS FOR DENTAL FOLLOW UP CARE  [place] [facility] and [place] [email or web address] [phone])  [facility] [phone])  [place] [phone])  [place] [phone])  [facility] [phone]) This clinic caters to the indigent population and is on a lottery system. Location: [employer] [facility], [place], [place], [place] Clinic Hours: Wednesdays from 6pm - 9pm, patients seen by a lottery system. For dates, call or go to [email or web address] Services: Cleanings, fillings and simple extractions. Payment Options: DENTAL WORK IS FREE OF CHARGE. Bring proof of income or support. Best way to get seen: Arrive at 5:15 pm - this is a lottery, NOT first come/first serve, so arriving earlier will not increase your chances of being seen.     [facility] [phone] Select option 1 for emergencies   Location: [facility] of Dentistry, [place], [address], [place] Clinic Hours: No walk-ins accepted - call the day before to schedule an appointment. Check in times are 9:30 am and 1:30 pm. Services: Simple extractions, temporary fillings, pulpectomy/pulp debridement, uncomplicated abscess drainage. Payment Options: PAYMENT IS DUE AT THE TIME OF SERVICE.  Fee is usually $100-200, additional surgical procedures (e.g. abscess drainage) may be extra. Cash, checks, Visa/MasterCard accepted.  Can file Medicaid if patient is covered for dental - patient should call case worker to check. No discount for [facility] patients. Best way to  get seen: MUST call the day before and get onto the schedule. Can usually be seen the next 1-2 days. No walk-ins accepted.     [place] [phone]   Location: [place], [place] Clinic Hours: M, W, Th, F 8am or 1:30pm, Tues 9a or 1:30 - first come/first served. Services: Simple extractions, temporary fillings, uncomplicated abscess drainage.  You do not need to be an [facility] resident. Payment Options: PAYMENT IS DUE AT THE TIME OF SERVICE. Dental insurance, otherwise sliding scale - bring proof of income or support. Depending on income and treatment needed, cost is usually $50-200. Best way to get seen: Arrive early as it is first come/first served.     [facility] [phone]   Location: [place] [facility] Hours: Mon-Thu 8a-5p Services: Most basic dental services including extractions and fillings. Payment Options: PAYMENT IS DUE AT THE TIME OF SERVICE. Sliding scale, up to 50% off - bring proof if income or support. Medicaid with dental option accepted. Best way to get seen: Call to schedule an appointment, can usually be seen within 2 weeks OR they will try to see walk-ins - show up at [place] or 2p (you may have to wait).     [facility] [place] RESIDENTS ONLY   Location: [facility], [place] [address] [place], [place] [address] Clinic Hours: By appointment only. Monday - Thursday 8am-5pm, Friday 8am-12pm Services: Cleanings, fillings, extractions. Payment Options: PAYMENT IS DUE AT THE TIME OF SERVICE. Cash, Visa or MasterCard. Sliding scale - $30 minimum per service. Best way to get seen: Come in to office, complete packet and make an appointment - need proof of income or support monies  for each household member and proof of [facility] residence. Usually takes about a month to get in.     [facility] [phone]   Location: [address] [place] Clinic Hours: Walk-in Urgent Care Dental Services are offered Monday-Friday mornings only. The numbers of emergencies accepted daily is limited to the number of providers available. Maximum 15 - Mondays, Wednesdays & Thursdays Maximum 10 - Tuesdays & Fridays Services: You do not need to be a [facility] resident to be seen for a dental emergency. Emergencies are defined as pain, swelling, abnormal bleeding, or dental trauma. Walkins will receive x-rays if needed. NOTE: Dental cleaning is not an emergency. Payment Options: PAYMENT IS DUE AT THE TIME OF SERVICE. Minimum co-pay is $40.00 for uninsured patients. Minimum co-pay is $3.00 for Medicaid with dental coverage. Dental Insurance is accepted and must be presented at time of visit. Medicare does not cover dental. Forms of payment: Cash, credit card, checks. Best way to get seen: If not previously registered with the clinic, walk-in dental registration begins at 7:15 am and is on a first come/first serve basis. If previously registered with the clinic, call to make an appointment.     [facility] [place] ONLY   Location: [address], [place], [place] Clinic Hours: Mon-Thu 10a-2p Services: Extractions only! Payment Options: FREE (donations accepted) - bring proof of income or support Best way to get seen: Call and schedule an appointment OR come at 8am on the 1st Monday of every month (except for holidays) when it is first come/first served.     [facility] [phone]   Location: [place], [place] Clinic Hours: Friday mornings Services, Payment Options, Best way to get seen: Call for info

## 2017-08-07 NOTE — ED Triage Notes (Signed)
Pt c/o left lower molar pain for the past week

## 2017-08-07 NOTE — ED Notes (Signed)
See triage note  Presents with pain to left side of face with some swelling  States she had a broken tooth to same side  States pain is now radiating into ear

## 2017-08-07 NOTE — ED Provider Notes (Signed)
[facility] Emergency Department Provider Note   ____________________________________________   First MD Initiated Contact with Patient 08/07/17 [phone]     (approximate)  I have reviewed the triage vital signs and the nursing notes.   HISTORY  Chief Complaint Dental Pain   HPI Kristina Lowery is a 37 y.o. female  Is here with complaint of left lower dental pain.  Patient states that for the past week she has had pain.  She also has been sick and had a temperature of 102 several days ago which she felt was the flu.  She also has a history of pneumonia in September.  She is unaware of any shortness of breath today but is aware that she is wheezing.  She does not take any over-the-counter medication for her dental pain.  Past Medical History:  Diagnosis Date  . Asthma     There are no active problems to display for this patient.   History reviewed. No pertinent surgical history.  Prior to Admission medications   Medication Sig Start Date End Date Taking? Authorizing Provider  albuterol (PROVENTIL HFA;VENTOLIN HFA) 108 (90 Base) MCG/ACT inhaler Inhale 2 puffs into the lungs every 6 (six) hours as needed for wheezing or shortness of breath. 12/17/16   Sable Feil, PA-C  albuterol (PROVENTIL) (2.5 MG/3ML) 0.083% nebulizer solution Take 3 mLs (2.5 mg total) by nebulization every 6 (six) hours as needed for wheezing or shortness of breath. 04/07/17   Cuthriell, Charline Bills, PA-C  amoxicillin (AMOXIL) 500 MG capsule Take 1 capsule (500 mg total) by mouth 3 (three) times daily. 08/07/17   Johnn Hai, PA-C  diphenhydrAMINE (BENADRYL) 25 MG tablet Take 25 mg by mouth 2 (two) times daily as needed for itching or allergies.    [provider]  ibuprofen (ADVIL,MOTRIN) 600 MG tablet Take 1 tablet (600 mg total) by mouth every 8 (eight) hours as needed. 08/07/17   Johnn Hai, PA-C    Allergies Apple; Banana; Kiwi extract; Strawberry extract; and  Watermelon [citrullus vulgaris]  No family history on file.  Social History Social History   Tobacco Use  . Smoking status: Current Every Day Smoker  . Smokeless tobacco: Never Used  Substance Use Topics  . Alcohol use: Yes  . Drug use: No    Review of Systems Constitutional: No fever/chills Eyes: No visual changes. ENT: Positive for dental pain. Cardiovascular: Denies chest pain. Respiratory: Denies shortness of breath.  Positive for cough and wheezing. Gastrointestinal:   No nausea, no vomiting.  Musculoskeletal: Negative for back pain. Skin: Negative for rash. Neurological: Negative for headaches, focal weakness or numbness. ____________________________________________   PHYSICAL EXAM:  VITAL SIGNS: ED Triage Vitals  Enc Vitals Group     BP 08/07/17 0818 (!) 151/80     Pulse Rate 08/07/17 0818 75     Resp 08/07/17 0818 20     Temp 08/07/17 0818 98.4 F (36.9 C)     Temp Source 08/07/17 0818 Oral     SpO2 08/07/17 0818 100 %     Weight 08/07/17 0819 198 lb (89.8 kg)     Height 08/07/17 0819 5\' 7"  (1.702 m)     Head Circumference --      Peak Flow --      Pain Score --      Pain Loc --      Pain Edu? --      Excl. in [place]? --    Constitutional: Alert and oriented. Well  appearing and in no acute distress. Eyes: Conjunctivae are normal.  Head: Atraumatic. Nose: No congestion/rhinnorhea. Mouth/Throat: Mucous membranes are moist.  Oropharynx non-erythematous.  There is gum swelling around the left lower molar posteriorly.  No active drainage is noted. Neck: No stridor.   Hematological/Lymphatic/Immunilogical: No cervical lymphadenopathy. Cardiovascular: Normal rate, regular rhythm. Grossly normal heart sounds.  Good peripheral circulation. Respiratory: Normal respiratory effort.  No retractions. Lungs diffuse expiratory wheezes heard throughout.  Patient is able to talk in complete sentences without any difficulty. Musculoskeletal: No lower extremity tenderness  nor edema.  No joint effusions. Neurologic:  Normal speech and language. No gross focal neurologic deficits are appreciated. No gait instability. Skin:  Skin is warm, dry and intact. No rash noted. Psychiatric: Mood and affect are normal. Speech and behavior are normal.  ____________________________________________   LABS (all labs ordered are listed, but only abnormal results are displayed)  Labs Reviewed - No data to display   RADIOLOGY  ED MD interpretation: No evidence of pneumonia.  Official radiology report(s): Dg Chest 2 View  Result Date: 08/07/2017 CLINICAL DATA:  Wheezing.  Shortness of breath. EXAM: CHEST  2 VIEW COMPARISON:  04/07/2017. FINDINGS: Mediastinum hilar structures normal. Lungs are clear. Heart size stable. No pulmonary venous congestion. No pleural effusion or pneumothorax. IMPRESSION: 1 no acute abnormality identified. Electronically Signed   By: Marcello Moores  Register   On: 08/07/2017 09:16   ___________________________________________   PROCEDURES  Procedure(s) performed: None  Procedures  Critical Care performed: No  ____________________________________________   INITIAL IMPRESSION / ASSESSMENT AND PLAN / ED COURSE Patient was reassured that the wheezing heard is mostly secondary to her cigarette smoking and asthma.  Patient was discharged with a prescription for amoxicillin 3 times daily for 10 days and ibuprofen 3 times daily with food.  Patient was given a list of dental clinics to follow-up with.  She is to return to the emergency department if any severe worsening of her symptoms or [facility] acute care. ____________________________________________   FINAL CLINICAL IMPRESSION(S) / ED DIAGNOSES  Final diagnoses:  Pain due to dental caries     ED Discharge Orders        Ordered    amoxicillin (AMOXIL) 500 MG capsule  3 times daily     08/07/17 0926    ibuprofen (ADVIL,MOTRIN) 600 MG tablet  Every 8 hours PRN     08/07/17 5852        Note:  This document was prepared using Dragon voice recognition software and may include unintentional dictation errors.    Johnn Hai, PA-C 08/07/17 1149    Harvest Dark, MD 08/07/17 1408

## 2017-12-08 ENCOUNTER — Emergency Department
Admission: EM | Admit: 2017-12-08 | Discharge: 2017-12-08 | Disposition: A | Discharge status: Home / Self Care | Payer: Self-pay | Attending: Emergency Medicine | Admitting: Emergency Medicine

## 2017-12-08 ENCOUNTER — Encounter: Payer: Self-pay | Admitting: Emergency Medicine

## 2017-12-08 ENCOUNTER — Other Ambulatory Visit: Payer: Self-pay

## 2017-12-08 DIAGNOSIS — J45909 Unspecified asthma, uncomplicated: Secondary | ICD-10-CM | POA: Insufficient documentation

## 2017-12-08 DIAGNOSIS — M545 Low back pain, unspecified: Secondary | ICD-10-CM

## 2017-12-08 DIAGNOSIS — K047 Periapical abscess without sinus: Secondary | ICD-10-CM | POA: Insufficient documentation

## 2017-12-08 DIAGNOSIS — F17200 Nicotine dependence, unspecified, uncomplicated: Secondary | ICD-10-CM | POA: Insufficient documentation

## 2017-12-08 LAB — URINALYSIS, COMPLETE (UACMP) WITH MICROSCOPIC
Bilirubin Urine: NEGATIVE
Glucose, UA: NEGATIVE mg/dL
HGB URINE DIPSTICK: NEGATIVE
Ketones, ur: NEGATIVE mg/dL
Leukocytes, UA: NEGATIVE
Nitrite: NEGATIVE
PROTEIN: NEGATIVE mg/dL
SPECIFIC GRAVITY, URINE: 1.014 (ref 1.005–1.030)
pH: 7 (ref 5.0–8.0)

## 2017-12-08 MED ORDER — IBUPROFEN 600 MG PO TABS: 600.0000 mg | ORAL_TABLET | Freq: Three times a day (TID) | ORAL | 0 refills | Status: DC | PRN

## 2017-12-08 MED ORDER — AMOXICILLIN 500 MG PO CAPS: 500.0000 mg | ORAL_CAPSULE | Freq: Three times a day (TID) | ORAL | 0 refills | Status: DC

## 2017-12-08 NOTE — ED Triage Notes (Signed)
Patient to ED with swelling left side of face, states she has a cracked tooth on that side, noticed a blister like area on her gum yesterday.  Obvious swelling noted.  SX began 2 weeks ago, "it popped and went down", recurring problem yesterday "it started flaring up even more".  States she was febrile yesterday - Temp 102 at home.  Took ibuprofen yesterday and this AM.  Temp 98.3 currently.

## 2017-12-08 NOTE — ED Provider Notes (Signed)
[facility] Emergency Department Provider Note ____________________________________________  Time seen: Approximately 10:36 AM  I have reviewed the triage vital signs and the nursing notes.   HISTORY  Chief Complaint Oral Swelling   HPI Kristina Lowery is a 37 y.o. female who presents to the emergency department for treatment and evaluation of dental pain.  She states that she has a cracked tooth on the left side that has been present since January.  She has had intermittent pain since then.  2 weeks ago she states that she had a abscess on her gum but it popped and then went away.  Pain and swelling returned yesterday and seems to be worse.  She states that she thinks she had a fever yesterday and this morning as well.  She took ibuprofen which provided some relief of pain and successful reduction of fever.  Patient states that she has a fairly new job and has had back pain for the past few weeks as well.  She also reports increased urinary frequency, but denies dysuria.   Past Medical History:  Diagnosis Date  . Asthma     There are no active problems to display for this patient.   History reviewed. No pertinent surgical history.  Prior to Admission medications   Medication Sig Start Date End Date Taking? Authorizing Provider  albuterol (PROVENTIL HFA;VENTOLIN HFA) 108 (90 Base) MCG/ACT inhaler Inhale 2 puffs into the lungs every 6 (six) hours as needed for wheezing or shortness of breath. 12/17/16   Sable Feil, PA-C  albuterol (PROVENTIL) (2.5 MG/3ML) 0.083% nebulizer solution Take 3 mLs (2.5 mg total) by nebulization every 6 (six) hours as needed for wheezing or shortness of breath. 04/07/17   Cuthriell, Charline Bills, PA-C  amoxicillin (AMOXIL) 500 MG capsule Take 1 capsule (500 mg total) by mouth 3 (three) times daily. 12/08/17   Myracle Febres, Johnette Abraham B, FNP  diphenhydrAMINE (BENADRYL) 25 MG tablet Take 25 mg by mouth 2 (two) times daily as needed for itching or  allergies.    [provider]  ibuprofen (ADVIL,MOTRIN) 600 MG tablet Take 1 tablet (600 mg total) by mouth every 8 (eight) hours as needed. 12/08/17   Mackensey Bolte, Johnette Abraham B, FNP    Allergies Apple; Banana; Kiwi extract; Strawberry extract; and Watermelon [citrullus vulgaris]  No family history on file.  Social History Social History   Tobacco Use  . Smoking status: Current Every Day Smoker  . Smokeless tobacco: Never Used  Substance Use Topics  . Alcohol use: Yes  . Drug use: No    Review of Systems Constitutional: Positive for fever. ENT: Positive for dental pain. Musculoskeletal: Positive for back pain. Skin: Positive for gum swelling. ____________________________________________   PHYSICAL EXAM:  VITAL SIGNS: ED Triage Vitals  Enc Vitals Group     BP 12/08/17 0835 (!) 133/95     Pulse Rate 12/08/17 0835 84     Resp 12/08/17 0835 16     Temp 12/08/17 0835 98.3 F (36.8 C)     Temp Source 12/08/17 0835 Oral     SpO2 12/08/17 0835 99 %     Weight 12/08/17 0837 201 lb (91.2 kg)     Height 12/08/17 0837 5\' 8"  (1.727 m)     Head Circumference --      Peak Flow --      Pain Score 12/08/17 0836 7     Pain Loc --      Pain Edu? --      Excl. in  [place]? --     Constitutional: Alert and oriented. Well appearing and in no acute distress. Eyes: Conjunctiva are clear without discharge or drainage. Mouth/Throat: Pinpoint area of fluctuance and purulent drainage is noted in the gumline on the left side near the back molars. Periodontal Exam Hematological/Lymphatic/Immunilogical: Palpable preauricular node on the left side. Respiratory: Respirations even and unlabored Musculoskeletal: Right back pain that radiates across and into the left side.  Mild CVA tenderness on exam. Neurologic: Awake, alert, oriented x4.  Motor and sensory function is intact throughout. Skin: No facial erythema or edema observed.  No open wound or lesion on exposed skin surfaces. Psychiatric:  Affect and behavior is appropriate.  ____________________________________________   LABS (all labs ordered are listed, but only abnormal results are displayed)  Labs Reviewed  URINALYSIS, COMPLETE (UACMP) WITH MICROSCOPIC - Abnormal; Notable for the following components:      Result Value   Color, Urine YELLOW (*)    APPearance CLEAR (*)    Bacteria, UA RARE (*)    All other components within normal limits   ____________________________________________   RADIOLOGY  Not indicated ____________________________________________   PROCEDURES  Procedure(s) performed:   Procedures  Critical Care performed: No ____________________________________________   INITIAL IMPRESSION / ASSESSMENT AND PLAN / ED COURSE  Kristina Lowery is a 37 y.o. female who presents to the emergency department for treatment and evaluation of dental pain and back pain.  Given her subjective history of fever, urinalysis was obtained despite a lack of dysuria.  No evidence of pyelonephritis is evident.  She will be placed on amoxicillin for her dental infection and was advised that she will need to see a dentist within the next 2 weeks.  She was encouraged to return to the emergency department or see her primary care provider for symptoms of concern.  Pertinent labs & imaging results that were available during my care of the patient were reviewed by me and considered in my medical decision making (see chart for details).  ____________________________________________   FINAL CLINICAL IMPRESSION(S) / ED DIAGNOSES  Final diagnoses:  Dental abscess  Acute bilateral low back pain without sciatica    Discharge Medication List as of 12/08/2017 10:37 AM      If controlled substance prescribed during this visit, 12 month history viewed on the [facility] prior to issuing an initial prescription for Schedule II or III opiod.  Note:  This document was prepared using Dragon voice recognition software and may include  unintentional dictation errors.    Victorino Dike, FNP 12/08/17 1215    Nena Polio, MD 12/08/17 [phone]

## 2017-12-08 NOTE — ED Notes (Signed)
See triage note. States she cracked a tooth  Now having swelling to gumline.  Also had fever at home   But afebrile on arrival

## 2017-12-08 NOTE — Discharge Instructions (Signed)
Please call and schedule a dental appointment as soon as possible. You will need to be seen within the next 14 days. Return to the emergency department for symptoms that change or worsen if you're unable to schedule an appointment. ° °

## 2019-05-18 ENCOUNTER — Emergency Department: Payer: Self-pay

## 2019-05-18 ENCOUNTER — Emergency Department
Admission: EM | Admit: 2019-05-18 | Discharge: 2019-05-18 | Disposition: A | Discharge status: Home / Self Care | Payer: Self-pay | Attending: Emergency Medicine | Admitting: Emergency Medicine

## 2019-05-18 ENCOUNTER — Encounter: Payer: Self-pay | Admitting: Emergency Medicine

## 2019-05-18 ENCOUNTER — Other Ambulatory Visit: Payer: Self-pay

## 2019-05-18 DIAGNOSIS — S4991XA Unspecified injury of right shoulder and upper arm, initial encounter: Secondary | ICD-10-CM | POA: Insufficient documentation

## 2019-05-18 DIAGNOSIS — Y929 Unspecified place or not applicable: Secondary | ICD-10-CM | POA: Insufficient documentation

## 2019-05-18 DIAGNOSIS — F17210 Nicotine dependence, cigarettes, uncomplicated: Secondary | ICD-10-CM | POA: Insufficient documentation

## 2019-05-18 DIAGNOSIS — J45909 Unspecified asthma, uncomplicated: Secondary | ICD-10-CM | POA: Insufficient documentation

## 2019-05-18 DIAGNOSIS — Y999 Unspecified external cause status: Secondary | ICD-10-CM | POA: Insufficient documentation

## 2019-05-18 DIAGNOSIS — Z79899 Other long term (current) drug therapy: Secondary | ICD-10-CM | POA: Insufficient documentation

## 2019-05-18 DIAGNOSIS — Y9351 Activity, roller skating (inline) and skateboarding: Secondary | ICD-10-CM | POA: Insufficient documentation

## 2019-05-18 DIAGNOSIS — W101XXA Fall (on)(from) sidewalk curb, initial encounter: Secondary | ICD-10-CM | POA: Insufficient documentation

## 2019-05-18 NOTE — ED Notes (Signed)
Back from x-ray  Awaiting results

## 2019-05-18 NOTE — Discharge Instructions (Addendum)
Please return for increasing numbness or any weakness.  Please follow-up with Dr. Sabra Heck the orthopedic surgeon on call.  Give his office a call and see if they can see you this coming week.  Use Motrin 3 of the over-the-counter pills 3 times a day with food for the next 3 or 4 days.  Try to use the arm and not keep it still all the time.

## 2019-05-18 NOTE — ED Notes (Signed)
See triage note  States she fell on Saturday while skating  [place] [address] backwards  Having pain to posterior right shoulder and arm  No deformity  Increased pain with movement

## 2019-05-18 NOTE — ED Triage Notes (Signed)
Pt reports she fell on Saturday while roller skating and since has had limited mobility as well as pain. No obvious deformity noted on assessment.

## 2019-05-18 NOTE — ED Provider Notes (Signed)
[facility] Emergency Department Provider Note   ____________________________________________   First MD Initiated Contact with Patient 05/18/19 0606     (approximate)  I have reviewed the triage vital signs and the nursing notes.   HISTORY  Chief Complaint Arm Injury    HPI Kristina Lowery is a 38 y.o. female who reports she was rollerskating Saturday night (this is very early Tuesday morning).  She fell backwards flat on her back and her arm 1 over and hit cement curb.  She complains of numbness and tingling in the right palm involving the long and index fingers and thumb.  Also has some tingling at the elbow.  There is tightness in her shoulder and her upper arm but no numbness or tingling.  There is no electric shock type pain either.  She has good motor strength in her arm.  Good pulses 2.  Patient also complains of pain in the thoracic spine where her bra strap crosses.  She took some Motrin yesterday and that seemed to help.  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         Past Medical History:  Diagnosis Date  . Asthma     There are no active problems to display for this patient.   History reviewed. No pertinent surgical history.  Prior to Admission medications   Medication Sig Start Date End Date Taking? Authorizing Provider  albuterol (PROVENTIL HFA;VENTOLIN HFA) 108 (90 Base) MCG/ACT inhaler Inhale 2 puffs into the lungs every 6 (six) hours as needed for wheezing or shortness of breath. 12/17/16   Sable Feil, PA-C  albuterol (PROVENTIL) (2.5 MG/3ML) 0.083% nebulizer solution Take 3 mLs (2.5 mg total) by nebulization every 6 (six) hours as needed for wheezing or shortness of breath. 04/07/17   Cuthriell, Charline Bills, PA-C  diphenhydrAMINE (BENADRYL) 25 MG tablet Take 25 mg by mouth 2 (two) times daily as needed for itching or allergies.    [provider]    Allergies Apple, Banana, Kiwi  extract, Strawberry extract, and Watermelon [citrullus vulgaris]  History reviewed. No pertinent family history.  Social History Social History   Tobacco Use  . Smoking status: Current Every Day Smoker  . Smokeless tobacco: Never Used  Substance Use Topics  . Alcohol use: Yes  . Drug use: No    Review of Systems  Constitutional: No fever/chills Eyes: No visual changes. ENT: No sore throat. Cardiovascular: Denies chest pain. Respiratory: Denies shortness of breath. Gastrointestinal: No abdominal pain.  No nausea, no vomiting.  No diarrhea.  No constipation. Genitourinary: Negative for dysuria. Musculoskeletal: See HPI Skin: Negative for rash. Neurological: Negative for headaches, focal weakness  ____________________________________________   PHYSICAL EXAM:  VITAL SIGNS: ED Triage Vitals [05/18/19 0521]  Enc Vitals Group     BP (!) 148/81     Pulse Rate 79     Resp 16     Temp 99 F (37.2 C)     Temp Source Oral     SpO2 97 %     Weight      Height      Head Circumference      Peak Flow      Pain Score      Pain Loc      Pain Edu?      Excl. in [place]?    Constitutional: Alert and oriented.  Holding her right arm up to her chest Eyes: Conjunctivae are normal.  Head: Atraumatic. Nose: No congestion/rhinnorhea. Mouth/Throat: Mucous membranes are moist.  Oropharynx non-erythematous. Neck: No stridor.  No cervical spine tenderness to palpation. Cardiovascular: Normal rate, regular rhythm.  Good peripheral circulation. Respiratory: Normal respiratory effort.  No retractions.  Musculoskeletal: Numbness is described in HPI there is no numbness elsewhere in the arm.  She has good range of motion of all the joints. Neurologic:  Normal speech and language. No gross focal neurologic deficits are appreciated.  Normal motor strength in the fingers hand and wrist and elbow.  Shoulder shrug is normal as well. Skin:  Skin is warm, dry and intact. No rash noted.    ____________________________________________   LABS (all labs ordered are listed, but only abnormal results are displayed)  Labs Reviewed - No data to display ____________________________________________  EKG   ____________________________________________  RADIOLOGY  ED MD interpretation: X-rays of the shoulder humerus and forearm are normal per radiology I reviewed the films T-spine films reviewed by me show some DJD I do not see any acute fractures.  Official radiology report(s): Dg Shoulder Right  Result Date: 05/18/2019 CLINICAL DATA:  Fall 3 days ago.  Pain.  Limited mobility. EXAM: RIGHT SHOULDER - 2+ VIEW COMPARISON:  None. FINDINGS: There is no evidence of fracture or dislocation. There is no evidence of arthropathy or other focal bone abnormality. Soft tissues are unremarkable. IMPRESSION: Negative right shoulder radiographs. Electronically Signed   By: San Morelle M.D.   On: 05/18/2019 05:55   Dg Forearm Right  Result Date: 05/18/2019 CLINICAL DATA:  Fall 3 days ago.  Pain.  Limited mobility. EXAM: RIGHT FOREARM - 2 VIEW COMPARISON:  None. FINDINGS: There is no evidence of fracture or other focal bone lesions. Soft tissues are unremarkable. IMPRESSION: Negative right forearm radiographs. Electronically Signed   By: San Morelle M.D.   On: 05/18/2019 05:59   Dg Humerus Right  Result Date: 05/18/2019 CLINICAL DATA:  Fall 3 days ago.  Pain.  Limited mobility. EXAM: RIGHT HUMERUS - 2+ VIEW COMPARISON:  Right shoulder radiographs of the same day. FINDINGS: There is no evidence of fracture or other focal bone lesions. Soft tissues are unremarkable. IMPRESSION: Negative right humerus. Electronically Signed   By: San Morelle M.D.   On: 05/18/2019 05:56    ____________________________________________   PROCEDURES  Procedure(s) performed (including Critical Care):  Procedures   ____________________________________________   INITIAL IMPRESSION  / ASSESSMENT AND PLAN / ED COURSE  Kristina Lowery was evaluated in Emergency Department on 05/18/2019 for the symptoms described in the history of present illness. She was evaluated in the context of the global COVID-19 pandemic, which necessitated consideration that the patient might be at risk for infection with the SARS-CoV-2 virus that causes COVID-19. Institutional protocols and algorithms that pertain to the evaluation of patients at risk for COVID-19 are in a state of rapid change based on information released by regulatory bodies including the CDC and federal and state organizations. These policies and algorithms were followed during the patient's care in the ED.              ____________________________________________   FINAL CLINICAL IMPRESSION(S) / ED DIAGNOSES  Final diagnoses:  Injury of right upper extremity, initial encounter     ED Discharge Orders    None       Note:  This document was prepared using Dragon voice recognition software and may include unintentional dictation errors.    Nena Polio, MD 05/18/19 [phone]

## 2019-12-12 ENCOUNTER — Encounter: Payer: Self-pay | Admitting: Emergency Medicine

## 2019-12-12 ENCOUNTER — Emergency Department: Payer: Self-pay

## 2019-12-12 ENCOUNTER — Other Ambulatory Visit: Payer: Self-pay

## 2019-12-12 ENCOUNTER — Emergency Department
Admission: EM | Admit: 2019-12-12 | Discharge: 2019-12-12 | Disposition: A | Discharge status: Home / Self Care | Payer: Self-pay | Attending: Emergency Medicine | Admitting: Emergency Medicine

## 2019-12-12 DIAGNOSIS — R05 Cough: Secondary | ICD-10-CM | POA: Insufficient documentation

## 2019-12-12 DIAGNOSIS — J029 Acute pharyngitis, unspecified: Secondary | ICD-10-CM | POA: Insufficient documentation

## 2019-12-12 DIAGNOSIS — J069 Acute upper respiratory infection, unspecified: Secondary | ICD-10-CM | POA: Insufficient documentation

## 2019-12-12 DIAGNOSIS — Z20822 Contact with and (suspected) exposure to covid-19: Secondary | ICD-10-CM | POA: Insufficient documentation

## 2019-12-12 DIAGNOSIS — R0602 Shortness of breath: Secondary | ICD-10-CM | POA: Insufficient documentation

## 2019-12-12 DIAGNOSIS — M7918 Myalgia, other site: Secondary | ICD-10-CM | POA: Insufficient documentation

## 2019-12-12 DIAGNOSIS — F17200 Nicotine dependence, unspecified, uncomplicated: Secondary | ICD-10-CM | POA: Insufficient documentation

## 2019-12-12 DIAGNOSIS — J45909 Unspecified asthma, uncomplicated: Secondary | ICD-10-CM | POA: Insufficient documentation

## 2019-12-12 LAB — SARS CORONAVIRUS 2 (TAT 6-24 HRS): SARS Coronavirus 2: NEGATIVE

## 2019-12-12 MED ORDER — ALBUTEROL SULFATE HFA 108 (90 BASE) MCG/ACT IN AERS: 2.0000 | INHALATION_SPRAY | Freq: Four times a day (QID) | RESPIRATORY_TRACT | 0 refills | Status: DC | PRN

## 2019-12-12 MED ORDER — AZITHROMYCIN 250 MG PO TABS: ORAL_TABLET | ORAL | 0 refills | Status: DC

## 2019-12-12 MED ORDER — AZITHROMYCIN 250 MG PO TABS: ORAL_TABLET | ORAL | 0 refills | Status: AC

## 2019-12-12 MED ORDER — DEXAMETHASONE 10 MG/ML FOR PEDIATRIC ORAL USE
16.0000 mg | Freq: Once | INTRAMUSCULAR | Status: AC
  Administered 2019-12-12: 16 mg via ORAL
  Filled 2019-12-12: qty 2

## 2019-12-12 MED ORDER — ALBUTEROL SULFATE HFA 108 (90 BASE) MCG/ACT IN AERS: 2.0000 | INHALATION_SPRAY | Freq: Four times a day (QID) | RESPIRATORY_TRACT | 0 refills | Status: AC | PRN

## 2019-12-12 MED ORDER — PREDNISONE 10 MG PO TABS: ORAL_TABLET | ORAL | 0 refills | Status: AC

## 2019-12-12 MED ORDER — IPRATROPIUM-ALBUTEROL 0.5-2.5 (3) MG/3ML IN SOLN
3.0000 mL | Freq: Once | RESPIRATORY_TRACT | Status: AC
Start: 2019-12-12 — End: 2019-12-12
  Administered 2019-12-12: 3 mL via RESPIRATORY_TRACT
  Filled 2019-12-12: qty 3

## 2019-12-12 MED ORDER — PREDNISONE 10 MG PO TABS: ORAL_TABLET | ORAL | 0 refills | Status: DC

## 2019-12-12 NOTE — ED Triage Notes (Signed)
Pt to ED via POV c/o generalized body aches, sore throat, and cough. Pt states that she has hx/o asthma and had an asthma attack yesterday. Pt reports having runny nose as well. Pt is able to speak in complete sentences at this time and not noted to be in distress.

## 2019-12-12 NOTE — ED Provider Notes (Signed)
[facility] Emergency Department Provider Note  ____________________________________________  Time seen: Approximately 8:34 AM  I have reviewed the triage vital signs and the nursing notes.   HISTORY  Chief Complaint Generalized Body Aches, Cough, and Nasal Congestion    HPI Kristina Lowery is a 39 y.o. female with past medical history of asthma that presents to the emergency department for evaluation of nasal congestion, sore throat, cough, SOB, body aches for 6 days. She started with diarrhea today. She used a nebulizer treatment at home.  She actually ran out of her nebulizer treatment and had to use her nephews.  She is out of her albuterol inhaler.  Patient was at the beach last weekend, just before symptoms started.  She has not been vaccinated against COVID-19.  No sick contacts.  No CP.    Past Medical History:  Diagnosis Date  . Asthma     There are no problems to display for this patient.   History reviewed. No pertinent surgical history.  Prior to Admission medications   Medication Sig Start Date End Date Taking? Authorizing Provider  albuterol (VENTOLIN HFA) 108 (90 Base) MCG/ACT inhaler Inhale 2 puffs into the lungs every 6 (six) hours as needed for wheezing or shortness of breath. 12/12/19   Laban Emperor, PA-C  azithromycin (ZITHROMAX Z-PAK) 250 MG tablet Take 2 tablets (500 mg) on  Day 1,  followed by 1 tablet (250 mg) once daily on Days 2 through 5. 12/12/19   Laban Emperor, PA-C  diphenhydrAMINE (BENADRYL) 25 MG tablet Take 25 mg by mouth 2 (two) times daily as needed for itching or allergies.    [provider]  predniSONE (DELTASONE) 10 MG tablet Take 6 tablets on day 1, take 5 tablets on day 2, take 4 tablets on day 3, take 3 tablets on day 4, take 2 tablets on day 5, take 1 tablet on day 6 12/12/19   Laban Emperor, PA-C    Allergies Apple, Banana, Kiwi extract, Strawberry extract, and Watermelon [citrullus vulgaris]  No family  history on file.  Social History Social History   Tobacco Use  . Smoking status: Current Every Day Smoker  . Smokeless tobacco: Never Used  Substance Use Topics  . Alcohol use: Yes  . Drug use: No     Review of Systems  Constitutional: No fever/chills Eyes: No visual changes. No discharge. ENT: Positive for congestion and rhinorrhea. Cardiovascular: No chest pain. Respiratory: Positive for cough and SOB. Gastrointestinal: No abdominal pain.  No nausea, no vomiting.  No diarrhea.  No constipation. Musculoskeletal: Positive for body aches. Skin: Negative for rash, abrasions, lacerations, ecchymosis. Neurological: Negative for headaches.   ____________________________________________   PHYSICAL EXAM:  VITAL SIGNS: ED Triage Vitals  Enc Vitals Group     BP 12/12/19 0754 (!) 152/100     Pulse Rate 12/12/19 0754 86     Resp 12/12/19 0754 18     Temp 12/12/19 0754 98.4 F (36.9 C)     Temp Source 12/12/19 0754 Oral     SpO2 12/12/19 0754 93 %     Weight 12/12/19 0754 210 lb (95.3 kg)     Height 12/12/19 0754 5\' 7"  (1.702 m)     Head Circumference --      Peak Flow --      Pain Score 12/12/19 0759 6     Pain Loc --      Pain Edu? --      Excl. in [place]? --  Constitutional: Alert and oriented. Well appearing and in no acute distress. Eyes: Conjunctivae are normal. PERRL. EOMI. No discharge. Head: Atraumatic. ENT: No frontal and maxillary sinus tenderness.      Ears: Tympanic membranes pearly gray with good landmarks. No discharge.      Nose: Mild congestion/rhinnorhea.      Mouth/Throat: Mucous membranes are moist. Oropharynx non-erythematous. Tonsils not enlarged. No exudates. Uvula midline. Neck: No stridor.   Hematological/Lymphatic/Immunilogical: No cervical lymphadenopathy. Cardiovascular: Normal rate, regular rhythm.  Good peripheral circulation. Respiratory: Normal respiratory effort without tachypnea or retractions. Lungs CTAB. Good air entry to the bases  with no decreased or absent breath sounds. Gastrointestinal: Bowel sounds 4 quadrants. Soft and nontender to palpation. No guarding or rigidity. No palpable masses. No distention. Musculoskeletal: Full range of motion to all extremities. No gross deformities appreciated. Neurologic:  Normal speech and language. No gross focal neurologic deficits are appreciated.  Skin:  Skin is warm, dry and intact. No rash noted. Psychiatric: Mood and affect are normal. Speech and behavior are normal. Patient exhibits appropriate insight and judgement.   ____________________________________________   LABS (all labs ordered are listed, but only abnormal results are displayed)  Labs Reviewed  SARS CORONAVIRUS 2 (TAT 6-24 HRS)   ____________________________________________  EKG   ____________________________________________  RADIOLOGY Robinette Haines, personally viewed and evaluated these images (plain radiographs) as part of my medical decision making, as well as reviewing the written report by the radiologist.  DG Chest 1 View  Result Date: 12/12/2019 CLINICAL DATA:  Generalized body aches, sore throat, cough. History of asthma and smoking. EXAM: CHEST  1 VIEW COMPARISON:  08/07/2017 FINDINGS: Heart size is normal. No focal consolidations or pleural effusions. No pulmonary edema. IMPRESSION: No active disease. Electronically Signed   By: Nolon Nations M.D.   On: 12/12/2019 08:49    ____________________________________________    PROCEDURES  Procedure(s) performed:    Procedures    Medications  ipratropium-albuterol (DUONEB) 0.5-2.5 (3) MG/3ML nebulizer solution 3 mL (3 mLs Nebulization Given 12/12/19 0928)  dexamethasone (DECADRON) 10 MG/ML injection for Pediatric ORAL use 16 mg (16 mg Oral Given 12/12/19 0927)     ____________________________________________   INITIAL IMPRESSION / ASSESSMENT AND PLAN / ED COURSE  Pertinent labs & imaging results that were available during my  care of the patient were reviewed by me and considered in my medical decision making (see chart for details).  Review of the [place] CSRS was performed in accordance of the [place] prior to dispensing any controlled drugs.     Patient's diagnosis is consistent with URI. Vital signs and exam are reassuring.  Chest x-ray negative for acute cardiopulmonary processes.  Patient was given a DuoNeb and Decadron in the emergency department.  O2 saturations remained 97 - 100% following medications.  Covid test is pending.  Patient appears well and is staying well hydrated. Patient should alternate tylenol and ibuprofen for fever. Patient feels comfortable going home. Patient will be discharged home with prescriptions for azithromycin, prednisone, albuterol inhaler. Patient is to follow up with primary care as needed or otherwise directed. Patient is given ED precautions to return to the ED for any worsening or new symptoms.   Lezlee Gills was evaluated in Emergency Department on 12/12/2019 for the symptoms described in the history of present illness. She was evaluated in the context of the global COVID-19 pandemic, which necessitated consideration that the patient might be at risk for infection with the SARS-CoV-2 virus that causes COVID-19. Institutional protocols and algorithms  that pertain to the evaluation of patients at risk for COVID-19 are in a state of rapid change based on information released by regulatory bodies including the CDC and federal and state organizations. These policies and algorithms were followed during the patient's care in the ED.  ____________________________________________  FINAL CLINICAL IMPRESSION(S) / ED DIAGNOSES  Final diagnoses:  Upper respiratory tract infection, unspecified type      NEW MEDICATIONS STARTED DURING THIS VISIT:  ED Discharge Orders         Ordered    azithromycin (ZITHROMAX Z-PAK) 250 MG tablet  Status:  Discontinued     12/12/19 1048    predniSONE  (DELTASONE) 10 MG tablet  Status:  Discontinued     12/12/19 1048    albuterol (VENTOLIN HFA) 108 (90 Base) MCG/ACT inhaler  Every 6 hours PRN,   Status:  Discontinued     12/12/19 1048    albuterol (VENTOLIN HFA) 108 (90 Base) MCG/ACT inhaler  Every 6 hours PRN     12/12/19 1058    azithromycin (ZITHROMAX Z-PAK) 250 MG tablet     12/12/19 1058    predniSONE (DELTASONE) 10 MG tablet     12/12/19 1058              This chart was dictated using voice recognition software/Dragon. Despite best efforts to proofread, errors can occur which can change the meaning. Any change was purely unintentional.    Laban Emperor, PA-C 12/12/19 1609    Arta Silence, MD 12/12/19 [phone]

## 2019-12-12 NOTE — ED Notes (Signed)
Patient c/o cold like symptoms of runny nose, sore throat, and cough for a couple of days. HX asthma. NAD noted. Unlabored breathing

## 2020-01-25 ENCOUNTER — Telehealth: Payer: Self-pay | Admitting: General Practice

## 2020-01-25 NOTE — Telephone Encounter (Signed)
Individual has been contacted 3+ times regarding ED referral. No further attempts to contact individual will be made. 

## 2020-05-12 ENCOUNTER — Other Ambulatory Visit: Payer: Self-pay

## 2020-05-12 ENCOUNTER — Emergency Department: Payer: Self-pay

## 2020-05-12 ENCOUNTER — Emergency Department
Admission: EM | Admit: 2020-05-12 | Discharge: 2020-05-12 | Disposition: A | Discharge status: Home / Self Care | Payer: Self-pay | Attending: Emergency Medicine | Admitting: Emergency Medicine

## 2020-05-12 DIAGNOSIS — J45909 Unspecified asthma, uncomplicated: Secondary | ICD-10-CM | POA: Insufficient documentation

## 2020-05-12 DIAGNOSIS — J069 Acute upper respiratory infection, unspecified: Secondary | ICD-10-CM | POA: Insufficient documentation

## 2020-05-12 DIAGNOSIS — J029 Acute pharyngitis, unspecified: Secondary | ICD-10-CM | POA: Insufficient documentation

## 2020-05-12 DIAGNOSIS — F17200 Nicotine dependence, unspecified, uncomplicated: Secondary | ICD-10-CM | POA: Insufficient documentation

## 2020-05-12 DIAGNOSIS — Z20822 Contact with and (suspected) exposure to covid-19: Secondary | ICD-10-CM | POA: Insufficient documentation

## 2020-05-12 LAB — RESPIRATORY PANEL BY RT PCR (FLU A&B, COVID)
Influenza A by PCR: NEGATIVE
Influenza B by PCR: NEGATIVE
SARS Coronavirus 2 by RT PCR: NEGATIVE

## 2020-05-12 LAB — GROUP A STREP BY PCR: Group A Strep by PCR: NOT DETECTED

## 2020-05-12 MED ORDER — METHYLPREDNISOLONE 4 MG PO TBPK: ORAL_TABLET | ORAL | 0 refills | Status: AC

## 2020-05-12 MED ORDER — PSEUDOEPH-BROMPHEN-DM 30-2-10 MG/5ML PO SYRP: 5.0000 mL | ORAL_SOLUTION | Freq: Four times a day (QID) | ORAL | 0 refills | Status: AC | PRN

## 2020-05-12 MED ORDER — IPRATROPIUM-ALBUTEROL 0.5-2.5 (3) MG/3ML IN SOLN
3.0000 mL | Freq: Once | RESPIRATORY_TRACT | Status: AC
  Administered 2020-05-12: 3 mL via RESPIRATORY_TRACT

## 2020-05-12 MED ORDER — ALBUTEROL SULFATE HFA 108 (90 BASE) MCG/ACT IN AERS: 2.0000 | INHALATION_SPRAY | Freq: Four times a day (QID) | RESPIRATORY_TRACT | 2 refills | Status: AC | PRN

## 2020-05-12 NOTE — ED Provider Notes (Signed)
[facility] Emergency Department Provider Note   ____________________________________________   First MD Initiated Contact with Patient 05/12/20 1354     (approximate)  I have reviewed the triage vital signs and the nursing notes.   HISTORY  Chief Complaint URI    HPI Kristina Lowery is a 39 y.o. female patient complains of 2 days of cough with nasal and chest congestion.  Patient also complain of fever and body aches.  Patient states she is around Covid negative sick family members with past week.  Patient also states she has a history of asthma has not had inhaler in over 2 months.  Patient also complain of sore throat but denies loss of taste or smell.  No recent travel.  Has not taken COVID-19 vaccine.         Past Medical History:  Diagnosis Date  . Asthma     There are no problems to display for this patient.   History reviewed. No pertinent surgical history.  Prior to Admission medications   Medication Sig Start Date End Date Taking? Authorizing Provider  albuterol (VENTOLIN HFA) 108 (90 Base) MCG/ACT inhaler Inhale 2 puffs into the lungs every 6 (six) hours as needed for wheezing or shortness of breath. 12/12/19   Laban Emperor, PA-C  albuterol (VENTOLIN HFA) 108 (90 Base) MCG/ACT inhaler Inhale 2 puffs into the lungs every 6 (six) hours as needed for wheezing or shortness of breath. 05/12/20   Sable Feil, PA-C  azithromycin (ZITHROMAX Z-PAK) 250 MG tablet Take 2 tablets (500 mg) on  Day 1,  followed by 1 tablet (250 mg) once daily on Days 2 through 5. 12/12/19   Laban Emperor, PA-C  brompheniramine-pseudoephedrine-DM 30-2-10 MG/5ML syrup Take 5 mLs by mouth 4 (four) times daily as needed. 05/12/20   Sable Feil, PA-C  diphenhydrAMINE (BENADRYL) 25 MG tablet Take 25 mg by mouth 2 (two) times daily as needed for itching or allergies.    [provider]  methylPREDNISolone (MEDROL DOSEPAK) 4 MG TBPK tablet Take Tapered dose as  directed 05/12/20   Sable Feil, PA-C  predniSONE (DELTASONE) 10 MG tablet Take 6 tablets on day 1, take 5 tablets on day 2, take 4 tablets on day 3, take 3 tablets on day 4, take 2 tablets on day 5, take 1 tablet on day 6 12/12/19   Laban Emperor, PA-C    Allergies Apple, Banana, Kiwi extract, Strawberry extract, and Watermelon [citrullus vulgaris]  No family history on file.  Social History Social History   Tobacco Use  . Smoking status: Current Every Day Smoker  . Smokeless tobacco: Never Used  Substance Use Topics  . Alcohol use: Yes  . Drug use: No    Review of Systems Constitutional: No fever/chills Eyes: No visual changes. ENT: No sore throat. Cardiovascular: Denies chest pain. Respiratory: Denies shortness of breath. Gastrointestinal: No abdominal pain.  No nausea, no vomiting.  No diarrhea.  No constipation. Genitourinary: Negative for dysuria. Musculoskeletal: Negative for back pain. Skin: Negative for rash. Neurological: Negative for headaches, focal weakness or numbness.  Hematological/Lymphatic:  Allergic/Immunilogical: Avelox bananas, kiwi extract, strawberries, and watermelon..  ____________________________________________   PHYSICAL EXAM:  VITAL SIGNS: ED Triage Vitals [05/12/20 1328]  Enc Vitals Group     BP (!) 148/100     Pulse Rate (!) 101     Resp 18     Temp 98.6 F (37 C)     Temp Source Oral     SpO2  96 %     Weight      Height      Head Circumference      Peak Flow      Pain Score      Pain Loc      Pain Edu?      Excl. in [place]?    Constitutional: Alert and oriented. Well appearing and in no acute distress. Nose: No congestion/rhinnorhea. Mouth/Throat: Mucous membranes are moist.  Oropharynx non-erythematous. Neck: No stridor.  Hematological/Lymphatic/Immunilogical: No cervical lymphadenopathy. Cardiovascular: Normal rate, regular rhythm. Grossly normal heart sounds.  Good peripheral circulation. Respiratory: Normal respiratory  effort.  No retractions. Lungs CTAB. Gastrointestinal: Soft and nontender. No distention. No abdominal bruits. No CVA tenderness. Musculoskeletal: No lower extremity tenderness nor edema.  No joint effusions. Neurologic:  Normal speech and language. No gross focal neurologic deficits are appreciated. No gait instability. Skin:  Skin is warm, dry and intact. No rash noted. Psychiatric: Mood and affect are normal. Speech and behavior are normal.  ____________________________________________   LABS (all labs ordered are listed, but only abnormal results are displayed)  Labs Reviewed  RESPIRATORY PANEL BY RT PCR (FLU A&B, COVID)  GROUP A STREP BY PCR   ____________________________________________  EKG   ____________________________________________  RADIOLOGY I, Sable Feil, personally viewed and evaluated these images (plain radiographs) as part of my medical decision making, as well as reviewing the written report by the radiologist.  ED MD interpretation: No acute findings on chest x-ray.  Official radiology report(s): DG Chest Portable 1 View  Result Date: 05/12/2020 CLINICAL DATA:  Productive cough and wheezing. EXAM: PORTABLE CHEST 1 VIEW COMPARISON:  December 12, 2019. FINDINGS: Similar cardiomediastinal silhouette. Both lungs are clear. No visible pleural effusions or pneumothorax. The visualized skeletal structures are unremarkable. Similar appearance of the chest in comparison to prior. IMPRESSION: No acute cardiopulmonary disease. Electronically Signed   By: Margaretha Sheffield MD   On: 05/12/2020 14:52    ____________________________________________   PROCEDURES  Procedure(s) performed (including Critical Care):  Procedures   ____________________________________________   INITIAL IMPRESSION / ASSESSMENT AND PLAN / ED COURSE  As part of my medical decision making, I reviewed the following data within the [facility]     Patient presents with 2 days  of cough, congestion, fever, and body aches.  Patient voices concern for COVID-19 since she has not taken the vaccine.  Discussed negative chest x-ray findings, COVID-19, influenza, and strep test with patient.  Patient complaining physical exam consistent with viral respiratory infection and pharyngitis.  Patient given discharge care instruction work note.  Patient advised take medication as directed and [facility].          ____________________________________________   FINAL CLINICAL IMPRESSION(S) / ED DIAGNOSES  Final diagnoses:  Viral URI with cough  Viral pharyngitis     ED Discharge Orders         Ordered    albuterol (VENTOLIN HFA) 108 (90 Base) MCG/ACT inhaler  Every 6 hours PRN        05/12/20 1536    brompheniramine-pseudoephedrine-DM 30-2-10 MG/5ML syrup  4 times daily PRN        05/12/20 1536    methylPREDNISolone (MEDROL DOSEPAK) 4 MG TBPK tablet        05/12/20 1536          *Please note:  Jenel Gierke was evaluated in Emergency Department on 05/12/2020 for the symptoms described in the history of present illness. She was  evaluated in the context of the global COVID-19 pandemic, which necessitated consideration that the patient might be at risk for infection with the SARS-CoV-2 virus that causes COVID-19. Institutional protocols and algorithms that pertain to the evaluation of patients at risk for COVID-19 are in a state of rapid change based on information released by regulatory bodies including the CDC and federal and state organizations. These policies and algorithms were followed during the patient's care in the ED.  Some ED evaluations and interventions may be delayed as a result of limited staffing during and the pandemic.*   Note:  This document was prepared using Dragon voice recognition software and may include unintentional dictation errors.    Sable Feil, PA-C 05/12/20 1540    Lucrezia Starch, MD 05/12/20 Kathyrn Drown

## 2020-05-12 NOTE — ED Notes (Signed)
Pt c/o fever since yesterday, asthma, congestion, coughing up phlegm. States she was around covid-negative sick family member. Pt is AOX4, NAD, ambulatory and speaking in full sentences. Inspiratory wheeze noted in bilateral lung fields.

## 2020-05-12 NOTE — ED Notes (Signed)
Pt reports "I can take a deep breath." Lung sound clear bilaterally. Cough noted.

## 2020-05-12 NOTE — ED Triage Notes (Signed)
Pt c/o cough with congestion, fever and bodyaches for the past 2 days. Pt is in NAD at present.

## 2020-05-12 NOTE — Discharge Instructions (Signed)
Your Covid test, flu test, and rapid strep test was negative.  Follow discharge care instruction take medication as directed.

## 2021-03-08 ENCOUNTER — Other Ambulatory Visit: Payer: Self-pay | Admitting: Unknown Physician Specialty

## 2021-03-08 DIAGNOSIS — D37030 Neoplasm of uncertain behavior of the parotid salivary glands: Secondary | ICD-10-CM

## 2021-03-28 ENCOUNTER — Ambulatory Visit: Payer: Self-pay

## 2021-04-02 ENCOUNTER — Other Ambulatory Visit: Payer: Self-pay

## 2021-04-02 ENCOUNTER — Ambulatory Visit
Admission: RE | Admit: 2021-04-02 | Discharge: 2021-04-02 | Disposition: A | Discharge status: Home / Self Care | Payer: BC Managed Care – PPO | Source: Ambulatory Visit | Attending: Unknown Physician Specialty | Admitting: Unknown Physician Specialty

## 2021-04-02 DIAGNOSIS — D37030 Neoplasm of uncertain behavior of the parotid salivary glands: Secondary | ICD-10-CM | POA: Insufficient documentation

## 2021-04-02 MED ORDER — IOHEXOL 350 MG/ML SOLN
75.0000 mL | Freq: Once | INTRAVENOUS | Status: AC | PRN
  Administered 2021-04-02: 75 mL via INTRAVENOUS

## 2021-04-09 ENCOUNTER — Other Ambulatory Visit: Payer: Self-pay | Admitting: Unknown Physician Specialty

## 2021-04-09 DIAGNOSIS — K118 Other diseases of salivary glands: Secondary | ICD-10-CM

## 2021-04-17 ENCOUNTER — Other Ambulatory Visit: Payer: Self-pay

## 2021-04-17 ENCOUNTER — Ambulatory Visit
Admission: RE | Admit: 2021-04-17 | Discharge: 2021-04-17 | Disposition: A | Discharge status: Home / Self Care | Payer: BC Managed Care – PPO | Source: Ambulatory Visit | Attending: Unknown Physician Specialty | Admitting: Unknown Physician Specialty

## 2021-04-17 DIAGNOSIS — D37030 Neoplasm of uncertain behavior of the parotid salivary glands: Secondary | ICD-10-CM | POA: Insufficient documentation

## 2021-04-17 DIAGNOSIS — K118 Other diseases of salivary glands: Secondary | ICD-10-CM

## 2021-04-17 NOTE — Procedures (Signed)
Interventional Radiology Procedure Note  Procedure: US Guided Biopsy of left parotid mass  Complications: None  Estimated Blood Loss: < 10 mL  Findings: 18 G core biopsy of left parotid mass performed under US guidance.  Four core samples obtained and sent to Pathology.  Rolena Knutson T. Jasha Hodzic, M.D Pager:  [phone]   

## 2021-04-18 LAB — SURGICAL PATHOLOGY

## 2022-02-19 ENCOUNTER — Ambulatory Visit
Admission: RE | Admit: 2022-02-19 | Discharge: 2022-02-19 | Disposition: A | Discharge status: Home / Self Care | Payer: Worker's Compensation | Attending: Nurse Practitioner | Admitting: Nurse Practitioner

## 2022-02-19 ENCOUNTER — Ambulatory Visit
Admission: RE | Admit: 2022-02-19 | Discharge: 2022-02-19 | Disposition: A | Discharge status: Home / Self Care | Payer: Worker's Compensation | Source: Ambulatory Visit | Attending: Nurse Practitioner | Admitting: Nurse Practitioner

## 2022-02-19 ENCOUNTER — Other Ambulatory Visit: Payer: Self-pay | Admitting: Nurse Practitioner

## 2022-02-19 DIAGNOSIS — M79642 Pain in left hand: Secondary | ICD-10-CM

## 2022-02-19 DIAGNOSIS — M25532 Pain in left wrist: Secondary | ICD-10-CM | POA: Insufficient documentation

## 2022-03-11 IMAGING — CT CT NECK W/ CM
5 series · 16 of 33 positions shown, 18 images · IV contrast (omnipaque)
Comparison: None.

CLINICAL DATA: Neoplasm of uncertain behavior of parotid gland.

EXAM:
CT NECK WITH CONTRAST
TECHNIQUE: Multidetector CT imaging of the neck was performed using the
standard protocol following the bolus administration of intravenous
contrast.
CONTRAST:  75mL OMNIPAQUE IOHEXOL 350 MG/ML SOLN

[Series 2: axial neck neck (person_name) 2.00 · axial · 0.60mm/px · z∈[-697,-581]mm · 3 of 118 slices shown]
[im 30/118  bone]
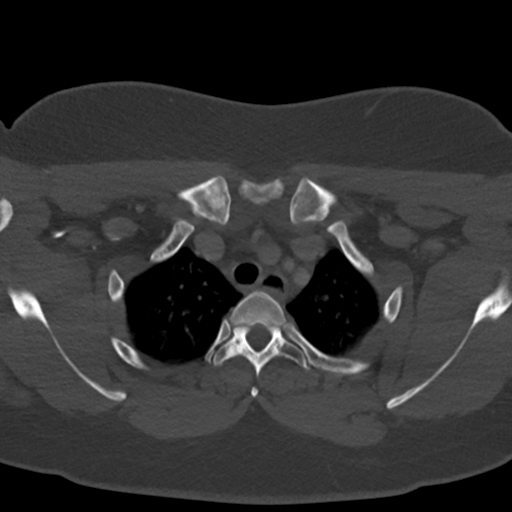
[im 59/118  bone]
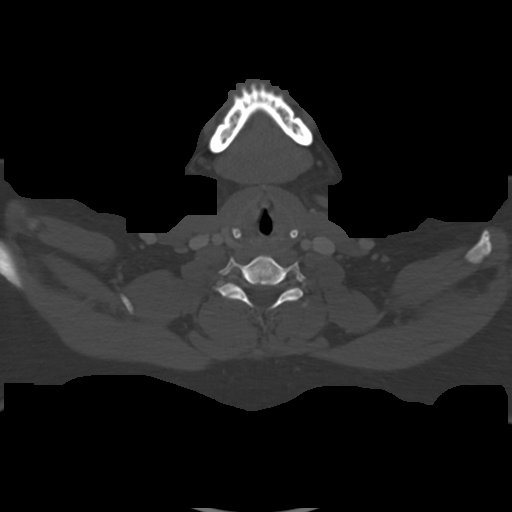
[im 88/118  bone]
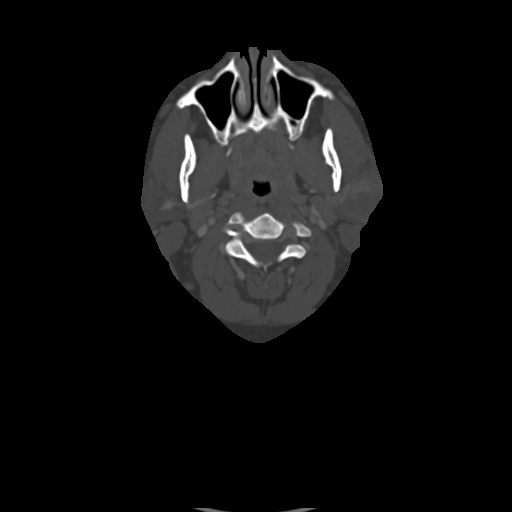

[Series 3: axial bone neck 2.00 · axial · 0.60mm/px · z∈[-677,-599]mm · 2 of 118 slices shown]
[im 40/118  bone]
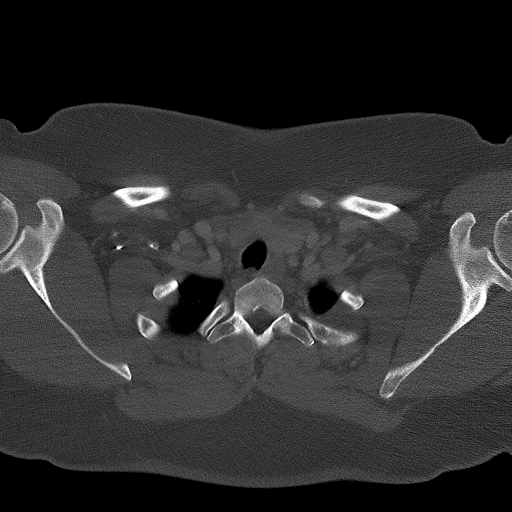
[im 79/118  bone]
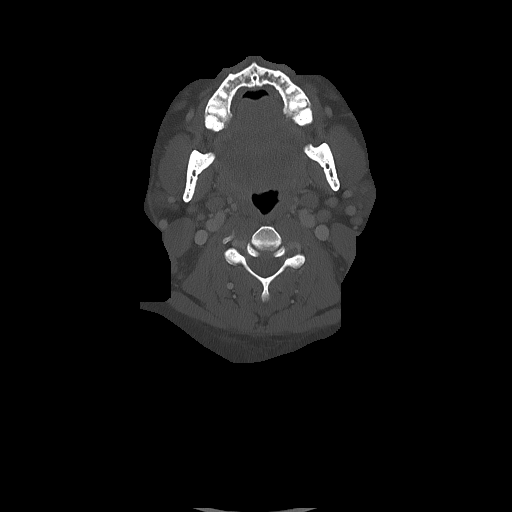

[Series 4: coronal neck neck (person_name) 2.00 cor · coronal · 0.59mm/px · 3 of 140 slices shown]
[im 35/140  bone]
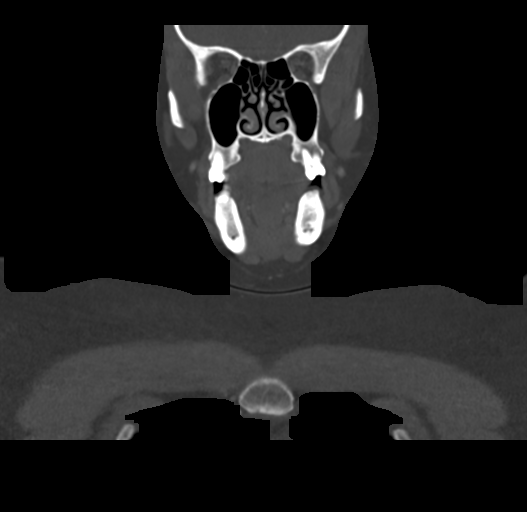
[im 58/140  bone]
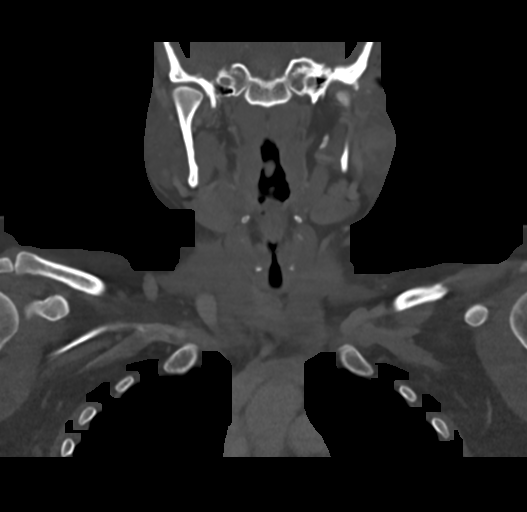
[im 82/140  bone]
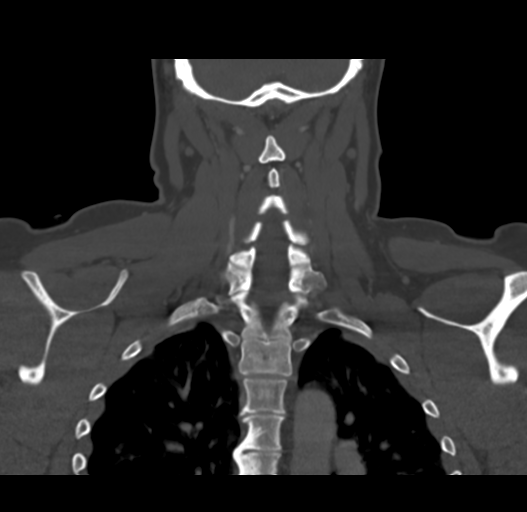

[Series 6: sagittal neck neck (person_name) 2.00 sag · sagittal · 0.55mm/px · 5 of 154 slices shown, 6 images]
[im 52/154  bone]
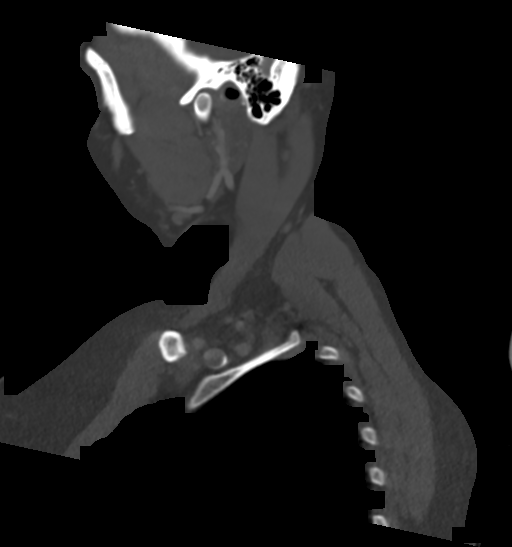
[im 64/154  bone]
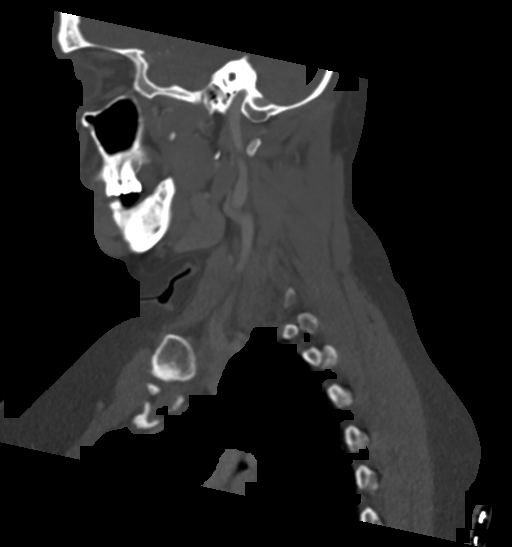
[im 77/154  soft-tissue]
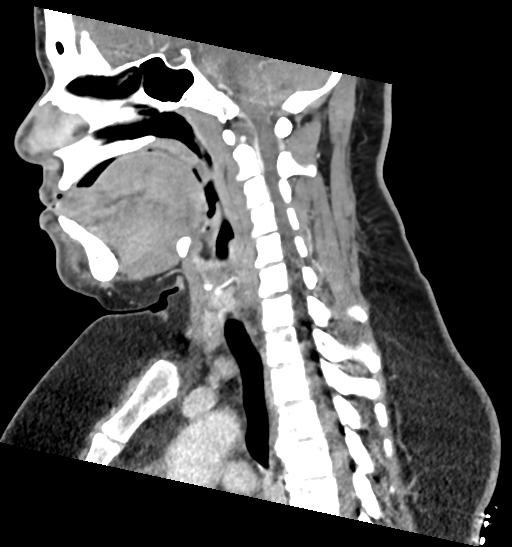
[im 77/154  bone]
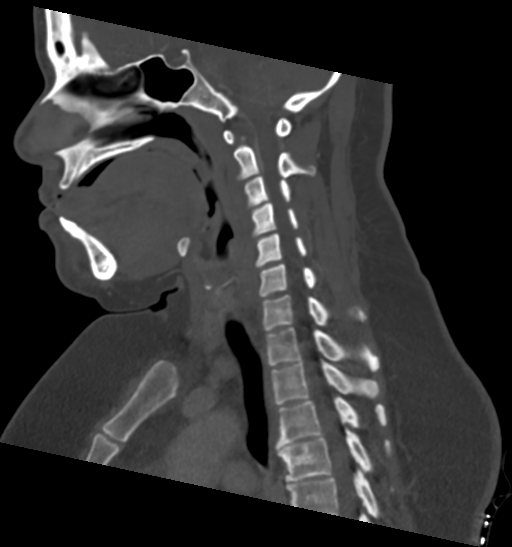
[im 90/154  bone]
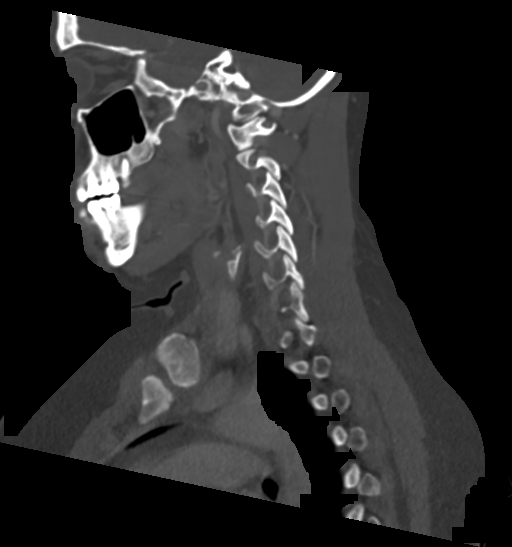
[im 103/154  bone]
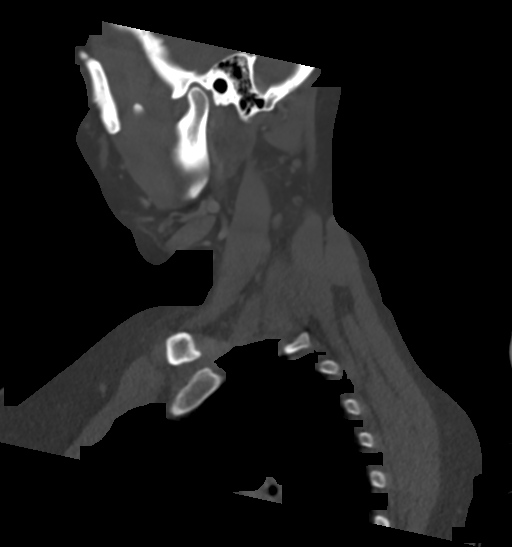

[Series 8: ax oropharynx neck neck (person_name) 2.00 ax · axial · 0.55mm/px · z∈[-739,-595]mm · 3 of 150 slices shown, 4 images]
[im 38/150  soft-tissue]
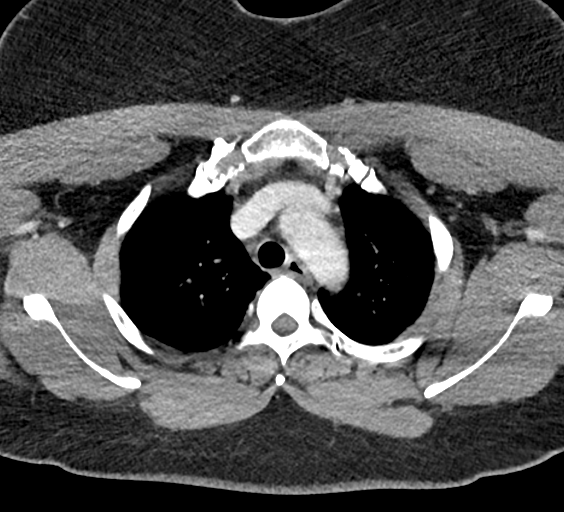
[im 38/150  bone]
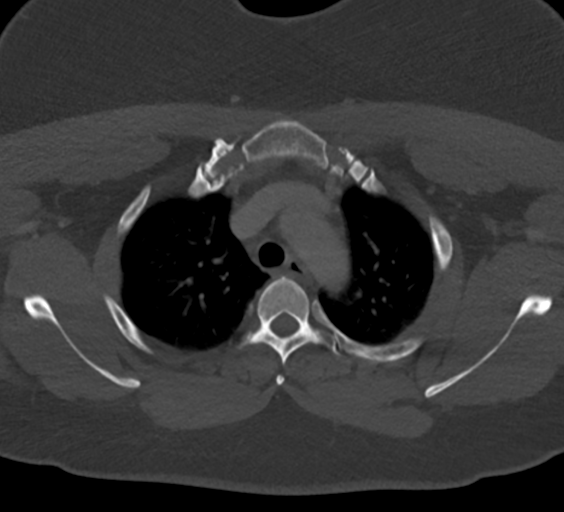
[im 75/150  bone]
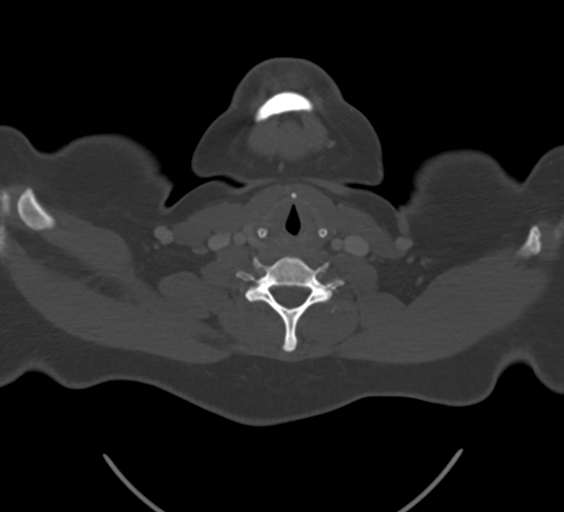
[im 112/150  bone]
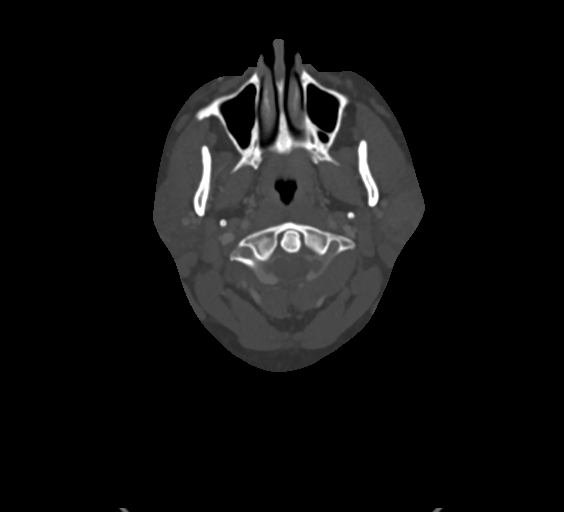

[16 of 33 positions shown; findings below may reference images not displayed]

FINDINGS: Pharynx and larynx: Normal. No mass or swelling.

Salivary glands: Lobulated mass occupying much of the parotid gland,
including the tail, measuring up to 3.6 cm. The mass has low-density
component which could be necrosis given the irregular contours.
There is a dominant solid component anteriorly. Narrowing without
visible invasion of the retromandibular vein. Maintained fat pads
adjacent to the left mandibular foramen and stylomastoid foramen.

Thyroid: Normal

Lymph nodes: Negative for adenopathy.

Vascular: Negative

Limited intracranial: Negative

Visualized orbits: Negative

Mastoids and visualized paranasal sinuses: Clear

Skeleton: Negative

Upper chest: Negative
IMPRESSION: 1. 3.6 cm left parotid mass with worrisome lobulation and possible
necrosis, recommend histologic correlation.
2. Negative for adenopathy.

## 2022-03-26 IMAGING — US IR BIOPSY CORE SALIVARY GLAND
1 series · 12 of 12 positions shown · non-contrast
Comparison: none

CLINICAL DATA: Palpable left parotid gland mass measuring up to
approximately 3.6 cm in maximum diameter by CT. The patient presents
for biopsy.

[Series 1: ir biopsy core salivary gland · 0.07mm/px · 12 acquisitions, 12 frames shown]
[im 1/12]
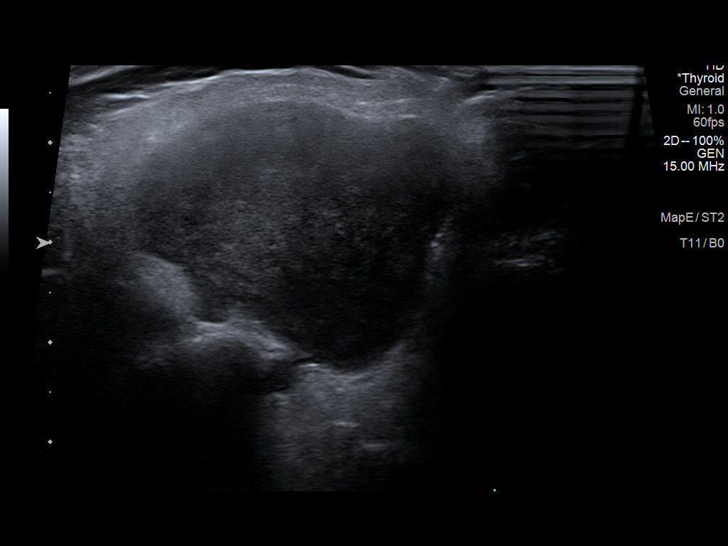
[im 2/12]
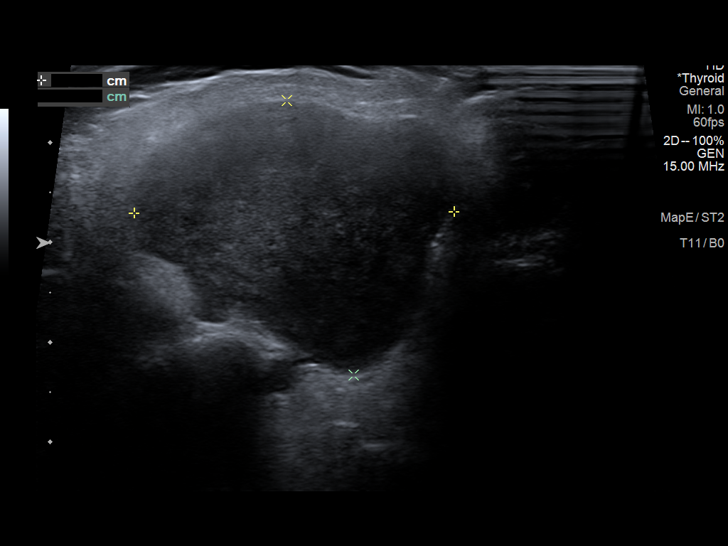
[im 3/12]
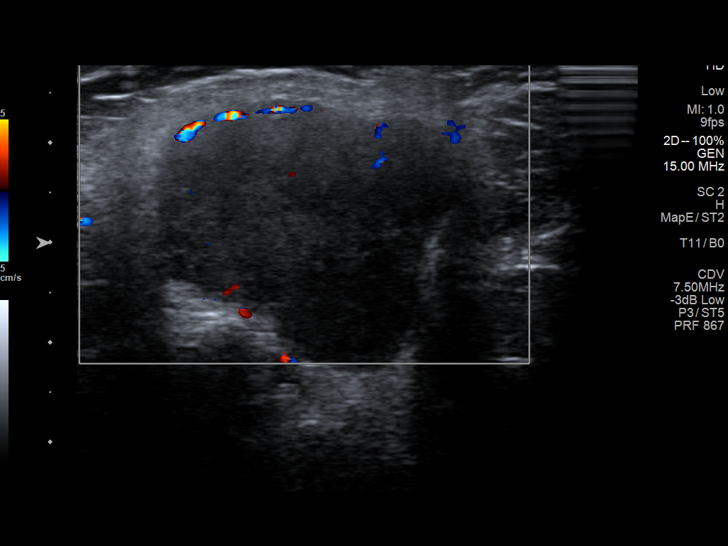
[im 4/12]
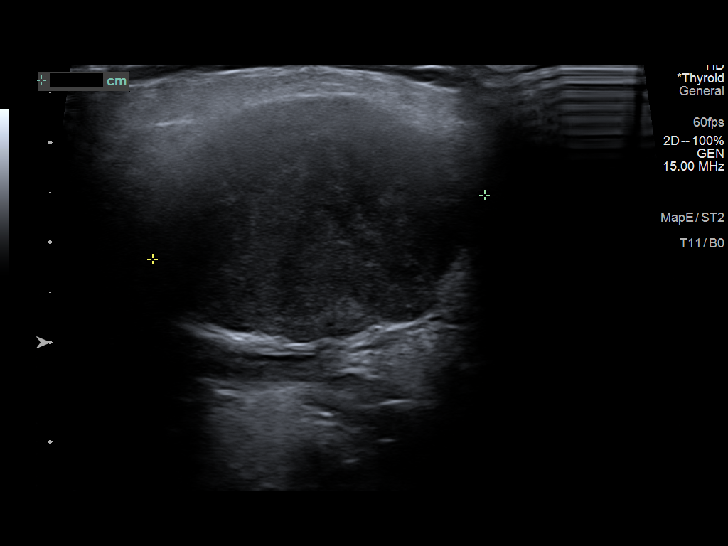
[im 5/12]
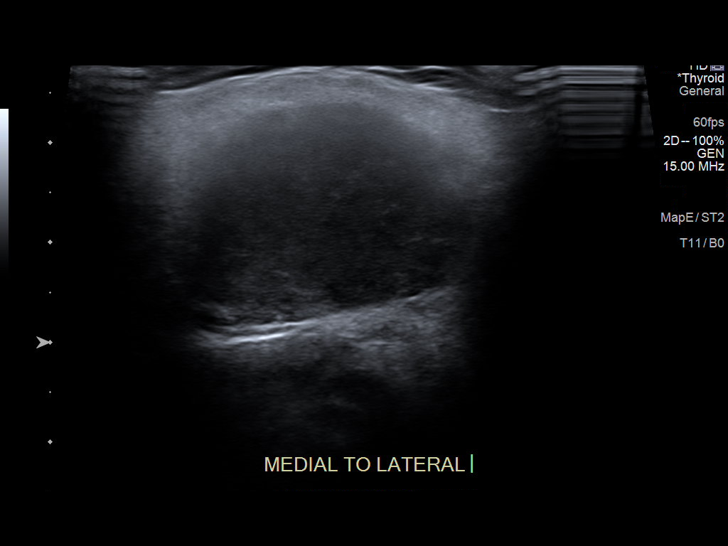
[im 6/12]
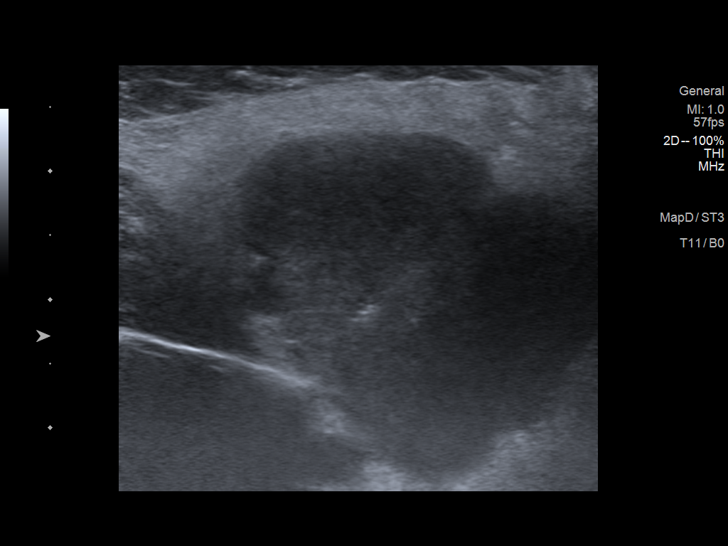
[im 7/12]
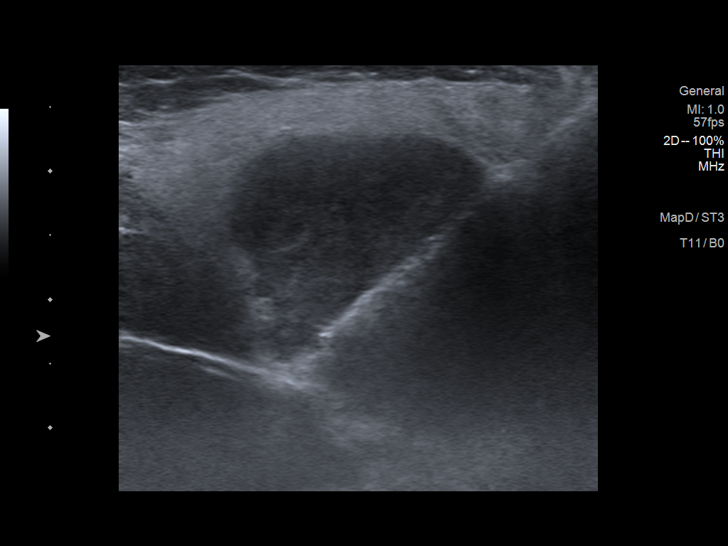
[im 8/12]
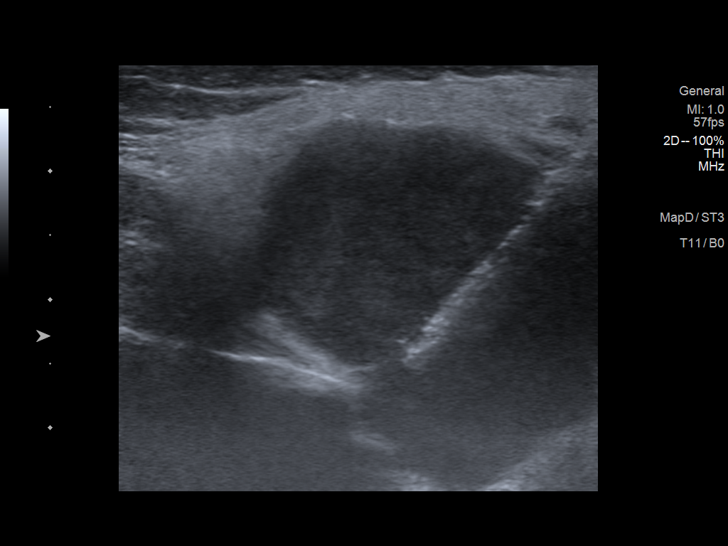
[im 9/12]
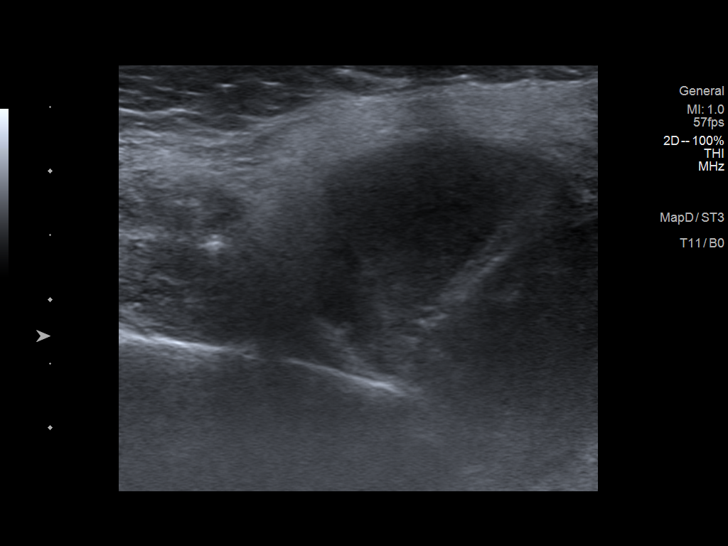
[im 10/12]
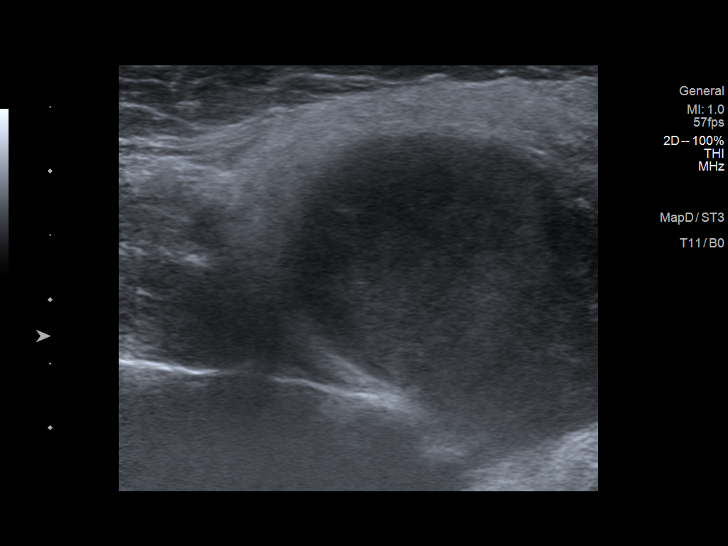
[im 11/12]
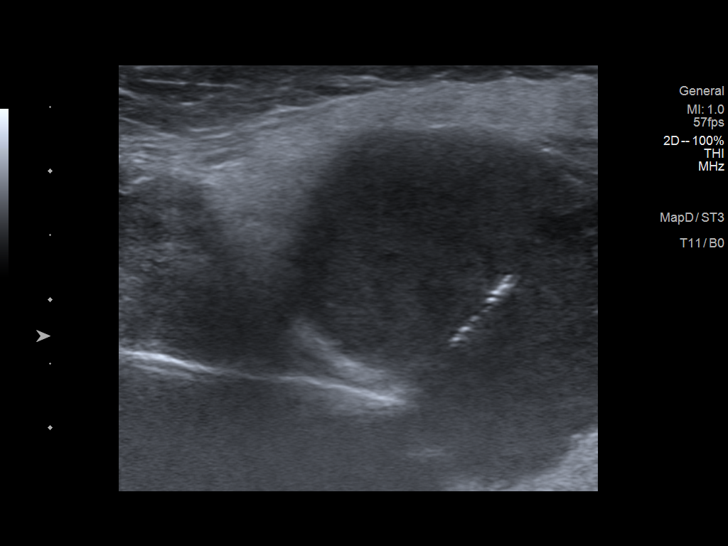
[im 12/12]
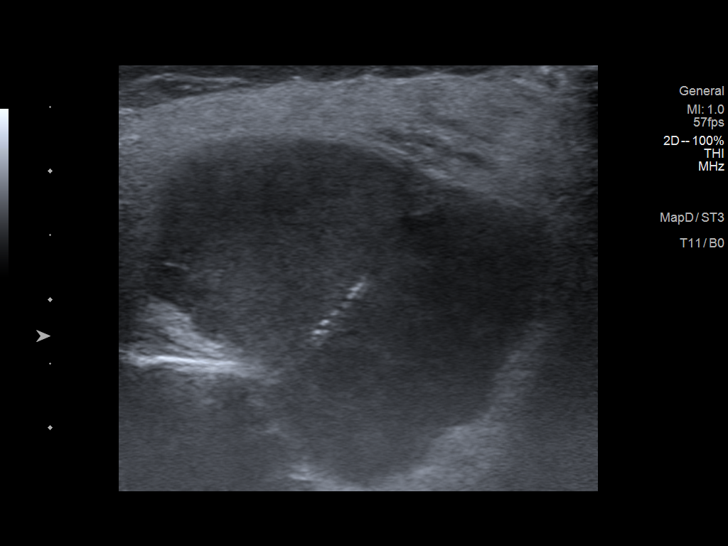

[12 of 12 positions shown; findings below may reference images not displayed]

EXAM:
ULTRASOUND GUIDED CORE BIOPSY OF LEFT PAROTID MASS

MEDICATIONS:
None

PROCEDURE:
The procedure, risks, benefits, and alternatives were explained to
the patient. Questions regarding the procedure were encouraged and
answered. The patient understands and consents to the procedure. A
time out was performed prior to initiating the procedure.

The left face was prepped with chlorhexidine in a sterile fashion,
and a sterile drape was applied covering the operative field. A
sterile gown and sterile gloves were used for the procedure. Local
anesthesia was provided with 1% Lidocaine.

After localizing a left parotid gland mass, 18 gauge core biopsy
samples were obtained under direct ultrasound guidance. A total of 4
core biopsy samples were obtained with 2 placed in formalin and 2 on
saline soaked Telfa gauze. Post biopsy imaging was performed with
ultrasound.

COMPLICATIONS:
None.
FINDINGS: Well-circumscribed and lobulated hypoechoic solid mass within the
left parotid gland is well visualized by ultrasound and measures
approximately 3.4 x 2.8 x 3.2 cm by ultrasound. Solid tissue was
obtained.
IMPRESSION: Ultrasound-guided core biopsy performed of a left parotid mass
measuring up to 3.4 cm by ultrasound.

## 2022-06-14 ENCOUNTER — Emergency Department
Admission: EM | Admit: 2022-06-14 | Discharge: 2022-06-14 | Discharge status: Against Medical Advice | Payer: BC Managed Care – PPO | Attending: Emergency Medicine | Admitting: Emergency Medicine

## 2022-06-14 ENCOUNTER — Other Ambulatory Visit: Payer: Self-pay

## 2022-06-14 ENCOUNTER — Emergency Department: Payer: BC Managed Care – PPO

## 2022-06-14 DIAGNOSIS — R079 Chest pain, unspecified: Secondary | ICD-10-CM | POA: Insufficient documentation

## 2022-06-14 DIAGNOSIS — Z5321 Procedure and treatment not carried out due to patient leaving prior to being seen by health care provider: Secondary | ICD-10-CM | POA: Diagnosis not present

## 2022-06-14 LAB — BASIC METABOLIC PANEL
Anion gap: 5 (ref 5–15)
BUN: 10 mg/dL (ref 6–20)
CO2: 24 mmol/L (ref 22–32)
Calcium: 9 mg/dL (ref 8.9–10.3)
Chloride: 109 mmol/L (ref 98–111)
Creatinine, Ser: 0.74 mg/dL (ref 0.44–1.00)
GFR, Estimated: >60 mL/min (ref >60)
Glucose, Bld: 93 mg/dL (ref 70–99)
Potassium: 3.6 mmol/L (ref 3.5–5.1)
Sodium: 138 mmol/L (ref 135–145)

## 2022-06-14 LAB — CBC
HCT: 35 % — ABNORMAL LOW (ref 36.0–46.0)
Hemoglobin: 11.3 g/dL — ABNORMAL LOW (ref 12.0–15.0)
MCH: 24.1 pg — ABNORMAL LOW (ref 26.0–34.0)
MCHC: 32.3 g/dL (ref 30.0–36.0)
MCV: 74.8 fL — ABNORMAL LOW (ref 80.0–100.0)
Platelets: 347 10*3/uL (ref 150–400)
RBC: 4.68 MIL/uL (ref 3.87–5.11)
RDW: 16.9 % — ABNORMAL HIGH (ref 11.5–15.5)
WBC: 8 10*3/uL (ref 4.0–10.5)
nRBC: 0 % (ref 0.0–0.2)

## 2022-06-14 LAB — POC URINE PREG, ED: Preg Test, Ur: NEGATIVE

## 2022-06-14 LAB — TROPONIN I (HIGH SENSITIVITY)
Troponin I (High Sensitivity): <2 ng/L (ref <18)
Troponin I (High Sensitivity): 3 ng/L (ref <18)

## 2022-06-14 NOTE — ED Triage Notes (Addendum)
Pt comes with c/o some cp since lifting a heavy box at work. Pt states this all started after lifting the box. Pt also states burning in throat and chest. Pt states some pain in mouth, jaw and underneath tongue.   Pt did take a tums with no relief.   Pt states also last few days she has been feeling bad and dizzy.

## 2022-06-14 NOTE — ED Provider Triage Note (Signed)
Emergency Medicine Provider Triage Evaluation Note  Laresha Bacorn , a 41 y.o. female  was evaluated in triage.  Pt complains of chest pain that radiates into her neck after lifting a heavy box at work. No cardiac history.   Physical Exam  There were no vitals taken for this visit. Gen:   Awake, no distress   Resp:  Normal effort  MSK:   Moves extremities without difficulty  Other:    Medical Decision Making  Medically screening exam initiated at 1:50 PM.  Appropriate orders placed.  Toniann Dickerson was informed that the remainder of the evaluation will be completed by another provider, this initial triage assessment does not replace that evaluation, and the importance of remaining in the ED until their evaluation is complete.    Victorino Dike, FNP 06/14/22 1718

## 2022-12-20 ENCOUNTER — Encounter: Payer: Self-pay | Admitting: Nurse Practitioner

## 2023-02-18 ENCOUNTER — Other Ambulatory Visit: Payer: Self-pay | Admitting: Nurse Practitioner

## 2023-02-18 DIAGNOSIS — Z1231 Encounter for screening mammogram for malignant neoplasm of breast: Secondary | ICD-10-CM

## 2023-02-24 ENCOUNTER — Encounter: Payer: Self-pay | Admitting: Nurse Practitioner

## 2023-02-27 ENCOUNTER — Encounter: Payer: Self-pay | Admitting: General Practice

## 2023-03-17 ENCOUNTER — Inpatient Hospital Stay: Payer: BC Managed Care – PPO | Admitting: Oncology

## 2023-03-17 ENCOUNTER — Inpatient Hospital Stay: Payer: BC Managed Care – PPO

## 2023-03-25 ENCOUNTER — Inpatient Hospital Stay: Payer: BC Managed Care – PPO | Attending: Oncology | Admitting: Oncology

## 2023-03-25 ENCOUNTER — Ambulatory Visit
Admission: RE | Admit: 2023-03-25 | Discharge: 2023-03-25 | Disposition: A | Discharge status: Home / Self Care | Payer: BC Managed Care – PPO | Source: Ambulatory Visit | Attending: Nurse Practitioner | Admitting: Nurse Practitioner

## 2023-03-25 ENCOUNTER — Encounter: Payer: Self-pay | Admitting: Oncology

## 2023-03-25 ENCOUNTER — Inpatient Hospital Stay: Payer: BC Managed Care – PPO

## 2023-03-25 VITALS — BP 145/91 | HR 62 | Temp 97.0°F | Resp 18 | Ht 66.0 in | Wt 226.8 lb

## 2023-03-25 DIAGNOSIS — Z1231 Encounter for screening mammogram for malignant neoplasm of breast: Secondary | ICD-10-CM | POA: Insufficient documentation

## 2023-03-25 DIAGNOSIS — D509 Iron deficiency anemia, unspecified: Secondary | ICD-10-CM | POA: Diagnosis present

## 2023-03-25 NOTE — Progress Notes (Signed)
Hematology/Oncology Consult note [facility] [phone] [phone]  Patient Care Team: Patient, No Pcp Per as PCP - General (General Practice)   Name of the patient: Kristina Lowery  [id number]  Nov 21, 1980    Reason for referral-iron deficiency anemia   Referring physician-Melissa Marina Goodell, FNP  Date of visit: 03/25/23   History of presenting illness- Patient is a 42 year old African-American female referred for iron deficiency anemia.  Labs from 02/24/2023 showed iron saturation of 7%, elevated TIBC of 461 CBC showed an H&H of 10.3/23.4 with an MCV of 74 white count and platelets were normal.  CMP was normal.  Ferritin levels low at 7.  B12 levels in June 2024 were low at 222.  Patient states that she started taking oral B12 and iron but is not consistent with that.  She does admit to using ibuprofen frequently due to the nature of her work which causes her to have bilateral hand pain.  Her menstrual cycles can be heavy and can last for up to 7 days.  Denies any blood loss in her stool or urine.  Denies any dark melanotic stools  ECOG PS- 0  Pain scale- 0   Review of systems- Review of Systems  Constitutional:  Positive for malaise/fatigue. Negative for chills, fever and weight loss.  HENT:  Negative for congestion, ear discharge and nosebleeds.   Eyes:  Negative for blurred vision.  Respiratory:  Negative for cough, hemoptysis, sputum production, shortness of breath and wheezing.   Cardiovascular:  Negative for chest pain, palpitations, orthopnea and claudication.  Gastrointestinal:  Negative for abdominal pain, blood in stool, constipation, diarrhea, heartburn, melena, nausea and vomiting.  Genitourinary:  Negative for dysuria, flank pain, frequency, hematuria and urgency.  Musculoskeletal:  Negative for back pain, joint pain and myalgias.  Skin:  Negative for rash.  Neurological:  Negative for dizziness, tingling, focal weakness, seizures,  weakness and headaches.  Endo/Heme/Allergies:  Does not bruise/bleed easily.  Psychiatric/Behavioral:  Negative for depression and suicidal ideas. The patient does not have insomnia.     Allergies  Allergen Reactions   Apple Juice Anaphylaxis, Hives and Swelling   Banana Anaphylaxis, Hives and Swelling   Kiwi Extract Anaphylaxis, Hives and Swelling   Strawberry Extract Anaphylaxis, Hives and Swelling   Watermelon [Citrullus Vulgaris] Anaphylaxis, Hives and Swelling   Mixed Grasses     There are no problems to display for this patient.    Past Medical History:  Diagnosis Date   Anxiety    Asthma      Past Surgical History:  Procedure Laterality Date   tumor removal in neck      Social History   Socioeconomic History   Marital status: Single    Spouse name: Not on file   Number of children: Not on file   Years of education: Not on file   Highest education level: Not on file  Occupational History   Not on file  Tobacco Use   Smoking status: Every Day   Smokeless tobacco: Never  Substance and Sexual Activity   Alcohol use: Yes   Drug use: No   Sexual activity: Not Currently  Other Topics Concern   Not on file  Social History Narrative   Not on file   Social Determinants of Health   Financial Resource Strain: Not on file  Food Insecurity: No Food Insecurity (03/25/2023)   Hunger Vital Sign    Worried About Running Out of Food in the Last Year: Never true  Ran Out of Food in the Last Year: Never true  Recent Concern: Food Insecurity - Food Insecurity Present (03/25/2023)   Hunger Vital Sign    Worried About Running Out of Food in the Last Year: Sometimes true    Ran Out of Food in the Last Year: Never true  Transportation Needs: No Transportation Needs (03/25/2023)   PRAPARE - Administrator, Civil Service (Medical): No    Lack of Transportation (Non-Medical): No  Physical Activity: Not on file  Stress: Not on file  Social Connections: Not on  file  Intimate Partner Violence: Not At Risk (03/25/2023)   Humiliation, Afraid, Rape, and Kick questionnaire    Fear of Current or Ex-Partner: No    Emotionally Abused: No    Physically Abused: No    Sexually Abused: No     Family History  Problem Relation Age of Onset   Stomach cancer Maternal Grandmother    Esophageal cancer Maternal Grandmother    Diabetes Paternal Grandmother      Current Outpatient Medications:    albuterol (VENTOLIN HFA) 108 (90 Base) MCG/ACT inhaler, Inhale 2 puffs into the lungs every 6 (six) hours as needed for wheezing or shortness of breath., Disp: 6.7 g, Rfl: 0   albuterol (VENTOLIN HFA) 108 (90 Base) MCG/ACT inhaler, Inhale 2 puffs into the lungs every 6 (six) hours as needed for wheezing or shortness of breath., Disp: 8 g, Rfl: 2   diphenhydrAMINE (BENADRYL) 25 MG tablet, Take 25 mg by mouth 2 (two) times daily as needed for itching or allergies., Disp: , Rfl:    methylPREDNISolone (MEDROL DOSEPAK) 4 MG TBPK tablet, Take Tapered dose as directed, Disp: 21 tablet, Rfl: 0   predniSONE (DELTASONE) 10 MG tablet, Take 6 tablets on day 1, take 5 tablets on day 2, take 4 tablets on day 3, take 3 tablets on day 4, take 2 tablets on day 5, take 1 tablet on day 6, Disp: 21 tablet, Rfl: 0   azithromycin (ZITHROMAX Z-PAK) 250 MG tablet, Take 2 tablets (500 mg) on  Day 1,  followed by 1 tablet (250 mg) once daily on Days 2 through 5. (Patient not taking: Reported on 03/25/2023), Disp: 6 each, Rfl: 0   brompheniramine-pseudoephedrine-DM 30-2-10 MG/5ML syrup, Take 5 mLs by mouth 4 (four) times daily as needed. (Patient not taking: Reported on 03/25/2023), Disp: 120 mL, Rfl: 0   Physical exam:  Vitals:   03/25/23 1105  BP: (!) 145/91  Pulse: 62  Resp: 18  Temp: (!) 97 F (36.1 C)  TempSrc: Tympanic  SpO2: 100%  Weight: 226 lb 12.8 oz (102.9 kg)  Height: 5\' 6"  (1.676 m)   Physical Exam Cardiovascular:     Rate and Rhythm: Normal rate and regular rhythm.      Heart sounds: Normal heart sounds.  Pulmonary:     Effort: Pulmonary effort is normal.     Breath sounds: Normal breath sounds.  Abdominal:     General: Bowel sounds are normal.     Palpations: Abdomen is soft.  Skin:    General: Skin is warm and dry.  Neurological:     Mental Status: She is alert and oriented to person, place, and time.           Latest Ref Rng & Units 06/14/2022    1:52 PM  CMP  Glucose 70 - 99 mg/dL 93   BUN 6 - 20 mg/dL 10   Creatinine 1.61 - 1.00 mg/dL 0.96  Sodium 135 - 145 mmol/L 138   Potassium 3.5 - 5.1 mmol/L 3.6   Chloride 98 - 111 mmol/L 109   CO2 22 - 32 mmol/L 24   Calcium 8.9 - 10.3 mg/dL 9.0       Latest Ref Rng & Units 06/14/2022    1:52 PM  CBC  WBC 4.0 - 10.5 K/uL 8.0   Hemoglobin 12.0 - 15.0 g/dL 47.8   Hematocrit 29.5 - 46.0 % 35.0   Platelets 150 - 400 K/uL 347     No images are attached to the encounter.  No results found.  Assessment and plan- Patient is a 42 y.o. female referred for iron deficiency anemia  Labs from August 2024 were indicative of iron deficiency anemia.  We will plan to proceed with IV iron at this time since patient is not consistent with her oral iron intake and would like to proceed with IV iron.  Based on her insurance she will need 5 doses of Venofer.  Discussed risks and benefits of Venofer including all but not limited to possible infusion reaction.  Patient understands and agrees to proceed as planned.  I will repeat CBC ferritin and iron studies B12 folate in 3 months and see her thereafter.  Suspect menorrhagia as the cause of her iron deficiency and I will be referring her to GYN.  Have also asked her to cut down on her ibuprofen and try to substitute it with Tylenol instead.  If iron deficiency persists despite GYN evaluation I will refer her to GI   Thank you for this kind referral and the opportunity to participate in the care of this patient   Visit Diagnosis 1. Iron deficiency anemia,  unspecified iron deficiency anemia type     Dr. Owens Shark, MD, MPH [facility] [phone] 03/25/2023

## 2023-04-03 ENCOUNTER — Encounter: Payer: Self-pay | Admitting: *Deleted

## 2023-04-04 ENCOUNTER — Inpatient Hospital Stay: Payer: BC Managed Care – PPO

## 2023-04-07 ENCOUNTER — Inpatient Hospital Stay: Payer: BC Managed Care – PPO

## 2023-04-11 ENCOUNTER — Inpatient Hospital Stay: Payer: BC Managed Care – PPO

## 2023-04-14 ENCOUNTER — Encounter: Payer: Self-pay | Admitting: Certified Nurse Midwife

## 2023-04-16 ENCOUNTER — Encounter: Payer: BC Managed Care – PPO | Admitting: Certified Nurse Midwife

## 2023-04-18 ENCOUNTER — Inpatient Hospital Stay: Payer: BC Managed Care – PPO

## 2023-04-25 ENCOUNTER — Inpatient Hospital Stay: Payer: BC Managed Care – PPO

## 2023-06-25 ENCOUNTER — Inpatient Hospital Stay: Payer: BC Managed Care – PPO | Admitting: Oncology

## 2023-06-25 ENCOUNTER — Encounter: Payer: Self-pay | Admitting: Oncology

## 2023-06-25 ENCOUNTER — Inpatient Hospital Stay: Payer: BC Managed Care – PPO | Attending: Oncology

## 2023-07-22 ENCOUNTER — Emergency Department: Payer: Worker's Compensation

## 2023-07-22 ENCOUNTER — Other Ambulatory Visit: Payer: Self-pay

## 2023-07-22 ENCOUNTER — Encounter: Payer: Self-pay | Admitting: Oncology

## 2023-07-22 ENCOUNTER — Emergency Department
Admission: EM | Admit: 2023-07-22 | Discharge: 2023-07-22 | Disposition: A | Discharge status: Home / Self Care | Payer: Worker's Compensation | Attending: Emergency Medicine | Admitting: Emergency Medicine

## 2023-07-22 DIAGNOSIS — M79601 Pain in right arm: Secondary | ICD-10-CM

## 2023-07-22 DIAGNOSIS — W010XXA Fall on same level from slipping, tripping and stumbling without subsequent striking against object, initial encounter: Secondary | ICD-10-CM | POA: Insufficient documentation

## 2023-07-22 DIAGNOSIS — Y990 Civilian activity done for income or pay: Secondary | ICD-10-CM | POA: Insufficient documentation

## 2023-07-22 DIAGNOSIS — M545 Low back pain, unspecified: Secondary | ICD-10-CM | POA: Diagnosis present

## 2023-07-22 DIAGNOSIS — M546 Pain in thoracic spine: Secondary | ICD-10-CM | POA: Insufficient documentation

## 2023-07-22 DIAGNOSIS — M549 Dorsalgia, unspecified: Secondary | ICD-10-CM

## 2023-07-22 DIAGNOSIS — M79621 Pain in right upper arm: Secondary | ICD-10-CM | POA: Insufficient documentation

## 2023-07-22 DIAGNOSIS — W19XXXA Unspecified fall, initial encounter: Secondary | ICD-10-CM

## 2023-07-22 MED ORDER — ACETAMINOPHEN 500 MG PO TABS
1000.0000 mg | ORAL_TABLET | Freq: Once | ORAL | Status: AC
Start: 2023-07-22 — End: 2023-07-22
  Administered 2023-07-22: 1000 mg via ORAL
  Filled 2023-07-22: qty 2

## 2023-07-22 MED ORDER — CYCLOBENZAPRINE HCL 10 MG PO TABS: 10.0000 mg | ORAL_TABLET | Freq: Three times a day (TID) | ORAL | 0 refills | Status: AC | PRN

## 2023-07-22 MED ORDER — LIDOCAINE 5 % EX PTCH
1.0000 | MEDICATED_PATCH | CUTANEOUS | Status: DC
  Administered 2023-07-22: 1 via TRANSDERMAL
  Filled 2023-07-22: qty 1

## 2023-07-22 MED ORDER — KETOROLAC TROMETHAMINE 30 MG/ML IJ SOLN
15.0000 mg | Freq: Once | INTRAMUSCULAR | Status: AC
  Administered 2023-07-22: 15 mg via INTRAMUSCULAR
  Filled 2023-07-22: qty 1

## 2023-07-22 NOTE — ED Provider Notes (Signed)
 [facility] Provider Note    Event Date/Time   First MD Initiated Contact with Patient 07/22/23 [phone]     (approximate)   History   Fall   HPI Kristina Lowery is a 43 y.o. female with history of anemia presenting with right sided pain after she tripped over a box at work.  Fell on her side, no head strike.  Did not pass out.  States that she is having a lot of pain to her right back and side.  Is not on any blood thinners.  States she scraped her arm against the box.     Physical Exam   Triage Vital Signs: ED Triage Vitals  Encounter Vitals Group     BP 07/22/23 0623 (!) 139/91     Systolic BP Percentile --      Diastolic BP Percentile --      Pulse Rate 07/22/23 0623 97     Resp 07/22/23 0623 18     Temp 07/22/23 0623 97.8 F (36.6 C)     Temp Source 07/22/23 0623 Oral     SpO2 07/22/23 0623 100 %     Weight 07/22/23 0621 230 lb (104.3 kg)     Height 07/22/23 0621 5' 7 (1.702 m)     Head Circumference --      Peak Flow --      Pain Score 07/22/23 0621 10     Pain Loc --      Pain Education --      Exclude from Growth Chart --     Most recent vital signs: Vitals:   07/22/23 0623  BP: (!) 139/91  Pulse: 97  Resp: 18  Temp: 97.8 F (36.6 C)  SpO2: 100%    General: Awake, no distress.  CV:  Good peripheral perfusion.  Resp:  Normal effort.  Abd:  No distention.  No guarding or rebound, no ecchymoses or swelling or erythema.  When palpating her belly she states that when I palpate her right abdomen she aching in her back. Other:  Full range of motion of bilateral upper and lower extremities are intact, she has some pain to palpation to her right lateral hip but strength and sensation are intact bilaterally.  No palpable deformities, no midline spinal tenderness or step-offs.  She also has tenderness to the right upper and lower back.  No saddle anesthesia.  No open cuts, she has a very mild scrape to her right posterior arm.   ED  Results / Procedures / Treatments   Labs (all labs ordered are listed, but only abnormal results are displayed) Labs Reviewed - No data to display    RADIOLOGY Based on my interpretation of imaging, x-ray of the chest without obvious fractures, x-ray of the humerus without obvious fractures.  Radiology impression of the right humerus as well as chest x-ray reports that no fractures were identified.  PROCEDURES:  Critical Care performed: No  Procedures   MEDICATIONS ORDERED IN ED: Medications  acetaminophen  (TYLENOL ) tablet 1,000 mg (has no administration in time range)  ketorolac  (TORADOL ) 30 MG/ML injection 15 mg (has no administration in time range)  lidocaine  (LIDODERM ) 5 % 1 patch (has no administration in time range)     IMPRESSION / MDM / ASSESSMENT AND PLAN / ED COURSE  I reviewed the triage vital signs and the nursing notes.  Differential diagnosis includes, but is not limited to, fracture, contusion, strain, sprain.  No palpable swelling to suggest hematoma, no evidence of ecchymoses.  No evidence of dislocation on physical exam.  No evidence of cauda equina since she has no midline spinal tenderness, no saddle anesthesia, no numbness or weakness.  Patient's presentation is most consistent with acute complicated illness / injury requiring diagnostic workup.  Patient presenting with right-sided pain after fall over a box, will start her on some medications, Tylenol , Toradol , Lidoderm  patch.  Patient is driving herself home, she decision-making with patient and she is agreeable plan for discharge, will give her prescription for Flexeril , instructed her to take ibuprofen  and Tylenol  every 6 hours as needed for the pain.  Strict and precautions given.  Offered her hip x-ray but patient declined.  States that she would like to return home.  Will give her a work note.  Considered but no indication for further workup or admission at this time, she is  safe for outpatient management.  Instructed her to follow-up with her primary care doctor in 2 to 3 days for reassessment.     FINAL CLINICAL IMPRESSION(S) / ED DIAGNOSES   Final diagnoses:  Fall, initial encounter  Acute right-sided back pain, unspecified back location  Pain of right upper extremity     Rx / DC Orders   ED Discharge Orders          Ordered    Ambulatory Referral to Primary Care (Establish Care)        07/22/23 1011    cyclobenzaprine  (FLEXERIL ) 10 MG tablet  3 times daily PRN        07/22/23 1012             Note:  This document was prepared using Dragon voice recognition software and may include unintentional dictation errors.    Waymond Lorelle Cummins, MD 07/22/23 [phone]

## 2023-07-22 NOTE — ED Notes (Signed)
 Pt reports that she tripped at work this am and fell hitting her right side on a box, pt states that it hurts to move, stand and walk, pt is sitting upright in a chair without signs of distress

## 2023-07-22 NOTE — ED Triage Notes (Signed)
 Pt reports she was at work and tripped over a box, landing on large wooden box on her right side. Pt c/o pain to right back r arm r rib.

## 2023-07-22 NOTE — Discharge Instructions (Addendum)
 You were evaluated in the emergency department for pain after falling at work, your x-rays did not show a fracture.  I suspect this is all musculoskeletal pain from the fall.  Please take 500 mg of Tylenol  and 400 mg of ibuprofen  every 6 hours as needed for the pain.  You can also use the prescribed Flexeril  to make sure that you are not driving or handling heavy machinery after you have taken it since it can make you drowsy.  Please follow up with primary care doctor in 2 3 days for reassessment.  Please return to the emergency department if you have any additional concerns or if anything changes or your pain gets worse.
